# Patient Record
Sex: Male | Born: 1995 | Hispanic: Refuse to answer | Smoking: Current every day smoker
Health system: Southern US, Community
[De-identification: ages and names within clinical notes are randomized; demographics above are authoritative.]

## PROBLEM LIST (undated history)

## (undated) DIAGNOSIS — J45909 Unspecified asthma, uncomplicated: Secondary | ICD-10-CM

## (undated) DIAGNOSIS — L03019 Cellulitis of unspecified finger: Secondary | ICD-10-CM

## (undated) HISTORY — DX: Cellulitis of unspecified finger: L03.019

---
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
## 2014-12-06 ENCOUNTER — Ambulatory Visit (INDEPENDENT_AMBULATORY_CARE_PROVIDER_SITE_OTHER): Payer: 59 | Admitting: Family Medicine

## 2014-12-06 ENCOUNTER — Encounter: Payer: Self-pay | Admitting: Family Medicine

## 2014-12-06 VITALS — BP 102/58 | HR 60 | Temp 97.8°F | Ht 70.0 in | Wt 146.5 lb

## 2014-12-06 DIAGNOSIS — M546 Pain in thoracic spine: Secondary | ICD-10-CM

## 2014-12-06 DIAGNOSIS — Z23 Encounter for immunization: Secondary | ICD-10-CM

## 2014-12-06 DIAGNOSIS — M2170 Unequal limb length (acquired), unspecified site: Secondary | ICD-10-CM

## 2014-12-06 DIAGNOSIS — M419 Scoliosis, unspecified: Secondary | ICD-10-CM

## 2014-12-06 NOTE — Progress Notes (Signed)
Pre visit review using our clinic review tool, if applicable. No additional management support is needed unless otherwise documented below in the visit note.  New patient.  Requesting records.  Flu shot done today.   Back pain.  H/o scoliosis and prev leg length discrepancy, noted in [employer] "when it looked funny when I tried to stand at attention and salute."  Upper mid back pain.  Noted in AM, when getting out of bed.  Better as the day goes on.  No trauma.  No bruising, no rash.  No arm or leg sx.  No weakness.   Is cutting back on smoking, down to a few cigs a day.   PMH and SH reviewed  ROS: See HPI, otherwise noncontributory.  Meds, vitals, and allergies reviewed.   GEN: nad, alert and oriented HEENT: mucous membranes moist NECK: supple w/o LA CV: rrr.  no murmur PULM: ctab, no inc wob ABD: soft, +bs EXT: no edema SKIN: no acute rash Mild scoliotic changes noted, L leg 1/4" longer on exam.

## 2014-12-06 NOTE — Patient Instructions (Signed)
Use a heel lift under your insole and that should help.  Keep stretching your back and take ibuprofen  twice a day if needed.  Take care.  Glad to see you.

## 2014-12-07 ENCOUNTER — Telehealth: Payer: Self-pay | Admitting: Family Medicine

## 2014-12-07 ENCOUNTER — Encounter: Payer: Self-pay | Admitting: Family Medicine

## 2014-12-07 DIAGNOSIS — M419 Scoliosis, unspecified: Secondary | ICD-10-CM | POA: Insufficient documentation

## 2014-12-07 DIAGNOSIS — M2170 Unequal limb length (acquired), unspecified site: Secondary | ICD-10-CM | POA: Insufficient documentation

## 2014-12-07 DIAGNOSIS — M549 Dorsalgia, unspecified: Secondary | ICD-10-CM | POA: Insufficient documentation

## 2014-12-07 NOTE — Assessment & Plan Note (Signed)
Likely from leg length discrepancy, d/w pt.  No specific intervention other then stretching his back and heel lift.

## 2014-12-07 NOTE — Assessment & Plan Note (Signed)
Likely from posture changes and scoliosis.  Would use a heel lift, f/u prn. He agrees.

## 2014-12-07 NOTE — Telephone Encounter (Signed)
emmi mailed  °

## 2014-12-07 NOTE — Assessment & Plan Note (Signed)
D/w pt about getting a heel lift. That should help.

## 2015-03-14 ENCOUNTER — Encounter: Payer: Self-pay | Admitting: Family Medicine

## 2015-03-14 ENCOUNTER — Ambulatory Visit (INDEPENDENT_AMBULATORY_CARE_PROVIDER_SITE_OTHER): Payer: 59 | Admitting: Family Medicine

## 2015-03-14 VITALS — BP 118/80 | HR 95 | Temp 97.5°F | Wt 144.5 lb

## 2015-03-14 DIAGNOSIS — S025XXB Fracture of tooth (traumatic), initial encounter for open fracture: Secondary | ICD-10-CM | POA: Diagnosis not present

## 2015-03-14 DIAGNOSIS — S025XXA Fracture of tooth (traumatic), initial encounter for closed fracture: Secondary | ICD-10-CM | POA: Insufficient documentation

## 2015-03-14 MED ORDER — IBUPROFEN 600 MG PO TABS
600.0000 mg | ORAL_TABLET | Freq: Four times a day (QID) | ORAL | Status: DC | PRN
Start: 2015-03-14 — End: 2016-03-09

## 2015-03-14 MED ORDER — HYDROCODONE-ACETAMINOPHEN 5-325 MG PO TABS: 1.0000 | ORAL_TABLET | Freq: Four times a day (QID) | ORAL | Status: DC | PRN

## 2015-03-14 NOTE — Assessment & Plan Note (Signed)
Wouldn't appear to need abx at this point.  Can use ibuprofen for pain with food.  Take vicodin for refractory pain with routine cautions re: sedation and driving.   Keep dental appointment.  We didn't xray today.  D/w pt.  He agrees.

## 2015-03-14 NOTE — Progress Notes (Signed)
Pre visit review using our clinic review tool, if applicable. No additional management support is needed unless otherwise documented below in the visit note.  Fell and chipped a tooth yesterday.  He slipped and hit a metal bar on the way down.  He felt the tooth pop.  L upper central incisor with lateral chip noted.  He has f/u with dental clinic on 03/17/15.    No jaw pain, just tooth pain.  No FCNAVD.  No LOC.  Tooth is cold sensitive.  No bleeding.    Meds, vitals, and allergies reviewed.   ROS: See HPI.  Otherwise, noncontributory.  nad ncat OP wnl w/o acute tooth change except for L upper central incisor with lateral chip noted.  Looks to be superficial, w/o exposed pulp/nerve.  Not noted to be mobile on exam but I didn't manipulate the tooth to a great extent.  Gum line appears grossly wnl

## 2015-03-14 NOTE — Patient Instructions (Signed)
Ibuprofen with food.  Take vicodon in the pain continues.   Sedation caution.  No chewing on your front teeth.  No cold foods.  If bleeding or fever, then notify us.  Keep the dental appointment.  Take care.

## 2016-03-06 ENCOUNTER — Emergency Department (HOSPITAL_COMMUNITY): Payer: Medicaid Other | Admitting: Certified Registered Nurse Anesthetist

## 2016-03-06 ENCOUNTER — Emergency Department (HOSPITAL_COMMUNITY): Payer: Medicaid Other

## 2016-03-06 ENCOUNTER — Inpatient Hospital Stay (HOSPITAL_COMMUNITY)
Admission: EM | Admit: 2016-03-06 | Discharge: 2016-03-08 | DRG: 956 | Disposition: A | Discharge status: Home Health | Payer: Medicaid Other | Attending: Orthopaedic Surgery | Admitting: Orthopaedic Surgery

## 2016-03-06 ENCOUNTER — Encounter (HOSPITAL_COMMUNITY)
Admission: EM | Disposition: A | Discharge status: Home Health | Payer: Self-pay | Source: Home / Self Care | Attending: Orthopaedic Surgery

## 2016-03-06 ENCOUNTER — Encounter (HOSPITAL_COMMUNITY): Payer: Self-pay | Admitting: *Deleted

## 2016-03-06 DIAGNOSIS — S7292XA Unspecified fracture of left femur, initial encounter for closed fracture: Secondary | ICD-10-CM | POA: Diagnosis present

## 2016-03-06 DIAGNOSIS — Z880 Allergy status to penicillin: Secondary | ICD-10-CM

## 2016-03-06 DIAGNOSIS — F17210 Nicotine dependence, cigarettes, uncomplicated: Secondary | ICD-10-CM | POA: Diagnosis present

## 2016-03-06 DIAGNOSIS — S72302A Unspecified fracture of shaft of left femur, initial encounter for closed fracture: Secondary | ICD-10-CM | POA: Diagnosis present

## 2016-03-06 DIAGNOSIS — S92341B Displaced fracture of fourth metatarsal bone, right foot, initial encounter for open fracture: Secondary | ICD-10-CM | POA: Diagnosis present

## 2016-03-06 DIAGNOSIS — S91321A Laceration with foreign body, right foot, initial encounter: Secondary | ICD-10-CM | POA: Diagnosis present

## 2016-03-06 DIAGNOSIS — S72332A Displaced oblique fracture of shaft of left femur, initial encounter for closed fracture: Principal | ICD-10-CM | POA: Diagnosis present

## 2016-03-06 DIAGNOSIS — S01511A Laceration without foreign body of lip, initial encounter: Secondary | ICD-10-CM | POA: Diagnosis present

## 2016-03-06 DIAGNOSIS — S27322A Contusion of lung, bilateral, initial encounter: Secondary | ICD-10-CM | POA: Diagnosis present

## 2016-03-06 HISTORY — PX: INCISION AND DRAINAGE OF WOUND: SHX1803

## 2016-03-06 HISTORY — PX: FEMUR IM NAIL: SHX1597

## 2016-03-06 LAB — PROTIME-INR
INR: 1.08 (ref 0.00–1.49)
Prothrombin Time: 14.2 seconds (ref 11.6–15.2)

## 2016-03-06 LAB — COMPREHENSIVE METABOLIC PANEL
ALT: 29 U/L (ref 17–63)
AST: 40 U/L (ref 15–41)
Albumin: 3.7 g/dL (ref 3.5–5.0)
Alkaline Phosphatase: 44 U/L (ref 38–126)
Anion gap: 10 (ref 5–15)
BUN: 9 mg/dL (ref 6–20)
CO2: 25 mmol/L (ref 22–32)
Calcium: 8.3 mg/dL — ABNORMAL LOW (ref 8.9–10.3)
Chloride: 106 mmol/L (ref 101–111)
Creatinine, Ser: 1.1 mg/dL (ref 0.61–1.24)
GFR calc Af Amer: >60 mL/min (ref >60)
GLUCOSE: 138 mg/dL — AB (ref 65–99)
POTASSIUM: 3.8 mmol/L (ref 3.5–5.1)
Sodium: 141 mmol/L (ref 135–145)
Total Bilirubin: 0.8 mg/dL (ref 0.3–1.2)
Total Protein: 6.2 g/dL — ABNORMAL LOW (ref 6.5–8.1)

## 2016-03-06 LAB — CBC
HEMATOCRIT: 47.7 % (ref 39.0–52.0)
Hemoglobin: 16.5 g/dL (ref 13.0–17.0)
MCH: 32 pg (ref 26.0–34.0)
MCHC: 34.6 g/dL (ref 30.0–36.0)
MCV: 92.6 fL (ref 78.0–100.0)
PLATELETS: 193 10*3/uL (ref 150–400)
RBC: 5.15 MIL/uL (ref 4.22–5.81)
RDW: 12.7 % (ref 11.5–15.5)
WBC: 28.3 10*3/uL — ABNORMAL HIGH (ref 4.0–10.5)

## 2016-03-06 LAB — SAMPLE TO BLOOD BANK

## 2016-03-06 LAB — CDS SEROLOGY

## 2016-03-06 LAB — ETHANOL: Alcohol, Ethyl (B): <5 mg/dL (ref <5)

## 2016-03-06 SURGERY — INSERTION, INTRAMEDULLARY ROD, FEMUR
Anesthesia: General | Site: Leg Upper | Laterality: Right

## 2016-03-06 MED ORDER — CLINDAMYCIN PHOSPHATE 900 MG/50ML IV SOLN
900.0000 mg | Freq: Once | INTRAVENOUS | Status: AC
  Administered 2016-03-06: 900 mg via INTRAVENOUS
  Filled 2016-03-06 (×2): qty 50

## 2016-03-06 MED ORDER — ROCURONIUM BROMIDE 50 MG/5ML IV SOLN
INTRAVENOUS | Status: AC
  Filled 2016-03-06: qty 1

## 2016-03-06 MED ORDER — SODIUM CHLORIDE 0.9 % IV BOLUS (SEPSIS)
1000.0000 mL | Freq: Once | INTRAVENOUS | Status: AC
  Administered 2016-03-06: 1000 mL via INTRAVENOUS

## 2016-03-06 MED ORDER — CEFAZOLIN SODIUM-DEXTROSE 2-4 GM/100ML-% IV SOLN
2.0000 g | Freq: Three times a day (TID) | INTRAVENOUS | Status: DC
  Filled 2016-03-06 (×3): qty 100

## 2016-03-06 MED ORDER — IOPAMIDOL (ISOVUE-300) INJECTION 61%
100.0000 mL | Freq: Once | INTRAVENOUS | Status: AC | PRN
  Administered 2016-03-06: 100 mL via INTRAVENOUS

## 2016-03-06 MED ORDER — LIDOCAINE HCL (CARDIAC) 20 MG/ML IV SOLN
INTRAVENOUS | Status: AC
  Filled 2016-03-06: qty 5

## 2016-03-06 MED ORDER — HYDROMORPHONE HCL 1 MG/ML IJ SOLN
INTRAMUSCULAR | Status: AC
  Filled 2016-03-06: qty 1

## 2016-03-06 MED ORDER — DEXAMETHASONE SODIUM PHOSPHATE 4 MG/ML IJ SOLN
INTRAMUSCULAR | Status: AC
  Filled 2016-03-06: qty 1

## 2016-03-06 MED ORDER — HYDROMORPHONE HCL 1 MG/ML IJ SOLN
1.0000 mg | Freq: Once | INTRAMUSCULAR | Status: AC
  Administered 2016-03-06: 1 mg via INTRAVENOUS

## 2016-03-06 MED ORDER — HYDROMORPHONE HCL 1 MG/ML IJ SOLN
INTRAMUSCULAR | Status: AC | PRN
  Administered 2016-03-06: 1 mg via INTRAVENOUS

## 2016-03-06 MED ORDER — FENTANYL CITRATE (PF) 250 MCG/5ML IJ SOLN
INTRAMUSCULAR | Status: AC
  Filled 2016-03-06: qty 5

## 2016-03-06 MED ORDER — MIDAZOLAM HCL 2 MG/2ML IJ SOLN
INTRAMUSCULAR | Status: AC
  Filled 2016-03-06: qty 2

## 2016-03-06 MED ORDER — TETANUS-DIPHTH-ACELL PERTUSSIS 5-2.5-18.5 LF-MCG/0.5 IM SUSP
0.5000 mL | Freq: Once | INTRAMUSCULAR | Status: AC
  Administered 2016-03-06: 0.5 mL via INTRAMUSCULAR
  Filled 2016-03-06: qty 0.5

## 2016-03-06 MED ORDER — PROPOFOL 10 MG/ML IV BOLUS
INTRAVENOUS | Status: AC
  Filled 2016-03-06: qty 20

## 2016-03-06 MED ORDER — IOPAMIDOL (ISOVUE-300) INJECTION 61%
INTRAVENOUS | Status: AC
Start: 2016-03-06 — End: 2016-03-07
  Filled 2016-03-06: qty 100

## 2016-03-06 MED ORDER — HYDROMORPHONE HCL 1 MG/ML IJ SOLN: 1.0000 mg | Freq: Once | INTRAMUSCULAR | Status: DC

## 2016-03-06 MED ORDER — ROCURONIUM BROMIDE 50 MG/5ML IV SOLN
INTRAVENOUS | Status: AC
  Filled 2016-03-06: qty 2

## 2016-03-06 SURGICAL SUPPLY — 53 items
BANDAGE ACE 4X5 VEL STRL LF (GAUZE/BANDAGES/DRESSINGS) ×2 IMPLANT
BIT DRILL LONG 4.0 (BIT) IMPLANT
BIT DRILL SHORT 4.0 (BIT) IMPLANT
BNDG COHESIVE 4X5 TAN NS LF (GAUZE/BANDAGES/DRESSINGS) ×4 IMPLANT
BNDG GAUZE ELAST 4 BULKY (GAUZE/BANDAGES/DRESSINGS) ×2 IMPLANT
COVER PERINEAL POST (MISCELLANEOUS) ×4 IMPLANT
COVER SURGICAL LIGHT HANDLE (MISCELLANEOUS) ×4 IMPLANT
DRAPE INCISE IOBAN 66X45 STRL (DRAPES) ×2 IMPLANT
DRAPE STERI IOBAN 125X83 (DRAPES) ×4 IMPLANT
DRILL BIT LONG 4.0 (BIT) ×4
DRILL BIT SHORT 4.0 (BIT) ×8
DRSG MEPILEX BORDER 4X4 (GAUZE/BANDAGES/DRESSINGS) ×2 IMPLANT
DRSG MEPILEX BORDER 4X8 (GAUZE/BANDAGES/DRESSINGS) ×4 IMPLANT
DRSG PAD ABDOMINAL 8X10 ST (GAUZE/BANDAGES/DRESSINGS) ×8 IMPLANT
DURAPREP 26ML APPLICATOR (WOUND CARE) ×4 IMPLANT
ELECT REM PT RETURN 9FT ADLT (ELECTROSURGICAL) ×4
ELECTRODE REM PT RTRN 9FT ADLT (ELECTROSURGICAL) ×2 IMPLANT
FACESHIELD WRAPAROUND (MASK) ×4 IMPLANT
FACESHIELD WRAPAROUND OR TEAM (MASK) ×2 IMPLANT
GAUZE SPONGE 4X4 12PLY STRL (GAUZE/BANDAGES/DRESSINGS) ×2 IMPLANT
GAUZE XEROFORM 1X8 LF (GAUZE/BANDAGES/DRESSINGS) ×2 IMPLANT
GAUZE XEROFORM 5X9 LF (GAUZE/BANDAGES/DRESSINGS) ×4 IMPLANT
GLOVE BIO SURGEON STRL SZ8 (GLOVE) ×4 IMPLANT
GLOVE BIOGEL PI IND STRL 8 (GLOVE) ×2 IMPLANT
GLOVE BIOGEL PI INDICATOR 8 (GLOVE) ×2
GLOVE ORTHO TXT STRL SZ7.5 (GLOVE) ×4 IMPLANT
GOWN STRL REUS W/ TWL LRG LVL3 (GOWN DISPOSABLE) ×2 IMPLANT
GOWN STRL REUS W/ TWL XL LVL3 (GOWN DISPOSABLE) ×4 IMPLANT
GOWN STRL REUS W/TWL LRG LVL3 (GOWN DISPOSABLE) ×4
GOWN STRL REUS W/TWL XL LVL3 (GOWN DISPOSABLE) ×8
GUIDE PIN 3.2X343 (PIN) ×2
GUIDE PIN 3.2X343MM (PIN) ×4
KIT BASIN OR (CUSTOM PROCEDURE TRAY) ×4 IMPLANT
KIT ROOM TURNOVER OR (KITS) ×4 IMPLANT
LINER BOOT UNIVERSAL DISP (MISCELLANEOUS) ×4 IMPLANT
MANIFOLD NEPTUNE II (INSTRUMENTS) ×4 IMPLANT
NAIL FEMORAL 10X40 L (Nail) ×2 IMPLANT
NS IRRIG 1000ML POUR BTL (IV SOLUTION) ×4 IMPLANT
PACK GENERAL/GYN (CUSTOM PROCEDURE TRAY) ×4 IMPLANT
PAD ARMBOARD 7.5X6 YLW CONV (MISCELLANEOUS) ×8 IMPLANT
PAD CAST 4YDX4 CTTN HI CHSV (CAST SUPPLIES) ×4 IMPLANT
PADDING CAST COTTON 4X4 STRL (CAST SUPPLIES) ×8
PIN GUIDE 3.2X343MM (PIN) IMPLANT
SCREW TRIGEN LOW PROF 5.0X35 (Screw) ×2 IMPLANT
SCREW TRIGEN LOW PROF 5.0X60 (Screw) ×2 IMPLANT
STAPLER VISISTAT 35W (STAPLE) ×4 IMPLANT
SUT VIC AB 0 CT1 27 (SUTURE) ×8
SUT VIC AB 0 CT1 27XBRD ANBCTR (SUTURE) ×4 IMPLANT
SUT VIC AB 2-0 CT1 27 (SUTURE) ×8
SUT VIC AB 2-0 CT1 TAPERPNT 27 (SUTURE) ×4 IMPLANT
TOWEL OR 17X24 6PK STRL BLUE (TOWEL DISPOSABLE) ×4 IMPLANT
TOWEL OR 17X26 10 PK STRL BLUE (TOWEL DISPOSABLE) ×4 IMPLANT
WATER STERILE IRR 1000ML POUR (IV SOLUTION) ×4 IMPLANT

## 2016-03-06 NOTE — ED Notes (Signed)
Consent for surgery signed by Dr.Blackman and father; pt remains in Ct at this time

## 2016-03-06 NOTE — H&P (Signed)
  See full consult note in EPIC

## 2016-03-06 NOTE — ED Notes (Signed)
Pt taken to Ct. 

## 2016-03-06 NOTE — ED Notes (Signed)
Sutures placed to L bottom lip

## 2016-03-06 NOTE — Progress Notes (Signed)
Orthopedic Tech Progress Note Patient Details:  Clayton Walker [birth year] [id number]  Musculoskeletal Traction Type of Traction: Gilberto BetterHare Traction Traction Location: LLE    [name], [name] 03/06/2016, 9:40 PM
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
## 2016-03-06 NOTE — Anesthesia Preprocedure Evaluation (Signed)
Anesthesia Evaluation  Patient identified by MRN, date of birth, ID band Patient awake    Reviewed: Allergy & Precautions, NPO status , Patient's Chart, lab work & pertinent test results  Airway Mallampati: I  TM Distance: >3 FB Neck ROM: Full    Dental   Pulmonary Current Smoker,    Pulmonary exam normal        Cardiovascular Normal cardiovascular exam     Neuro/Psych    GI/Hepatic   Endo/Other    Renal/GU      Musculoskeletal   Abdominal   Peds  Hematology   Anesthesia Other Findings   Reproductive/Obstetrics                             Anesthesia Physical Anesthesia Plan  ASA: II and emergent  Anesthesia Plan: General   Post-op Pain Management:    Induction: Intravenous, Rapid sequence and Cricoid pressure planned  Airway Management Planned: Oral ETT  Additional Equipment:   Intra-op Plan:   Post-operative Plan: Extubation in OR  Informed Consent: I have reviewed the patients History and Physical, chart, labs and discussed the procedure including the risks, benefits and alternatives for the proposed anesthesia with the patient or authorized representative who has indicated his/her understanding and acceptance.     Plan Discussed with: CRNA and Surgeon  Anesthesia Plan Comments:         Anesthesia Quick Evaluation  
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
## 2016-03-07 ENCOUNTER — Encounter (HOSPITAL_COMMUNITY): Payer: Self-pay | Admitting: Orthopaedic Surgery

## 2016-03-07 ENCOUNTER — Inpatient Hospital Stay (HOSPITAL_COMMUNITY): Payer: Medicaid Other

## 2016-03-07 DIAGNOSIS — Y999 Unspecified external cause status: Secondary | ICD-10-CM | POA: Diagnosis not present

## 2016-03-07 DIAGNOSIS — R509 Fever, unspecified: Secondary | ICD-10-CM | POA: Diagnosis not present

## 2016-03-07 DIAGNOSIS — S72302A Unspecified fracture of shaft of left femur, initial encounter for closed fracture: Secondary | ICD-10-CM | POA: Diagnosis present

## 2016-03-07 DIAGNOSIS — S7292XA Unspecified fracture of left femur, initial encounter for closed fracture: Secondary | ICD-10-CM | POA: Diagnosis present

## 2016-03-07 DIAGNOSIS — S0081XA Abrasion of other part of head, initial encounter: Secondary | ICD-10-CM | POA: Diagnosis not present

## 2016-03-07 DIAGNOSIS — S0993XA Unspecified injury of face, initial encounter: Secondary | ICD-10-CM | POA: Diagnosis not present

## 2016-03-07 DIAGNOSIS — R079 Chest pain, unspecified: Secondary | ICD-10-CM | POA: Diagnosis not present

## 2016-03-07 DIAGNOSIS — S01511A Laceration without foreign body of lip, initial encounter: Secondary | ICD-10-CM | POA: Diagnosis present

## 2016-03-07 DIAGNOSIS — S91321A Laceration with foreign body, right foot, initial encounter: Secondary | ICD-10-CM | POA: Diagnosis present

## 2016-03-07 DIAGNOSIS — S50811A Abrasion of right forearm, initial encounter: Secondary | ICD-10-CM | POA: Diagnosis not present

## 2016-03-07 DIAGNOSIS — Z4801 Encounter for change or removal of surgical wound dressing: Secondary | ICD-10-CM | POA: Diagnosis present

## 2016-03-07 DIAGNOSIS — F17210 Nicotine dependence, cigarettes, uncomplicated: Secondary | ICD-10-CM | POA: Diagnosis present

## 2016-03-07 DIAGNOSIS — S27322A Contusion of lung, bilateral, initial encounter: Secondary | ICD-10-CM | POA: Diagnosis present

## 2016-03-07 DIAGNOSIS — Y9389 Activity, other specified: Secondary | ICD-10-CM | POA: Diagnosis not present

## 2016-03-07 DIAGNOSIS — S72332A Displaced oblique fracture of shaft of left femur, initial encounter for closed fracture: Secondary | ICD-10-CM | POA: Diagnosis present

## 2016-03-07 DIAGNOSIS — W1841XA Slipping, tripping and stumbling without falling due to stepping on object, initial encounter: Secondary | ICD-10-CM | POA: Diagnosis not present

## 2016-03-07 DIAGNOSIS — S92341B Displaced fracture of fourth metatarsal bone, right foot, initial encounter for open fracture: Secondary | ICD-10-CM | POA: Diagnosis present

## 2016-03-07 DIAGNOSIS — Y9289 Other specified places as the place of occurrence of the external cause: Secondary | ICD-10-CM | POA: Diagnosis not present

## 2016-03-07 DIAGNOSIS — Z8781 Personal history of (healed) traumatic fracture: Secondary | ICD-10-CM | POA: Diagnosis not present

## 2016-03-07 DIAGNOSIS — Z880 Allergy status to penicillin: Secondary | ICD-10-CM | POA: Diagnosis not present

## 2016-03-07 DIAGNOSIS — R000 Tachycardia, unspecified: Secondary | ICD-10-CM | POA: Diagnosis not present

## 2016-03-07 DIAGNOSIS — S9031XA Contusion of right foot, initial encounter: Secondary | ICD-10-CM | POA: Diagnosis not present

## 2016-03-07 DIAGNOSIS — G8918 Other acute postprocedural pain: Secondary | ICD-10-CM | POA: Diagnosis not present

## 2016-03-07 DIAGNOSIS — J45901 Unspecified asthma with (acute) exacerbation: Secondary | ICD-10-CM | POA: Diagnosis not present

## 2016-03-07 MED ORDER — FENTANYL CITRATE (PF) 100 MCG/2ML IJ SOLN
INTRAMUSCULAR | Status: DC | PRN
  Administered 2016-03-06 – 2016-03-07 (×10): 50 ug via INTRAVENOUS

## 2016-03-07 MED ORDER — SUCCINYLCHOLINE CHLORIDE 20 MG/ML IJ SOLN
INTRAMUSCULAR | Status: DC | PRN
  Administered 2016-03-06: 120 mg via INTRAVENOUS

## 2016-03-07 MED ORDER — METOCLOPRAMIDE HCL 5 MG PO TABS: 5.0000 mg | ORAL_TABLET | Freq: Three times a day (TID) | ORAL | Status: DC | PRN

## 2016-03-07 MED ORDER — NEOSTIGMINE METHYLSULFATE 10 MG/10ML IV SOLN
INTRAVENOUS | Status: DC | PRN
  Administered 2016-03-07: 2.5 mg via INTRAVENOUS

## 2016-03-07 MED ORDER — GLYCOPYRROLATE 0.2 MG/ML IJ SOLN
INTRAMUSCULAR | Status: DC | PRN
  Administered 2016-03-07: .5 mg via INTRAVENOUS

## 2016-03-07 MED ORDER — MIDAZOLAM HCL 5 MG/5ML IJ SOLN
INTRAMUSCULAR | Status: DC | PRN
  Administered 2016-03-06: 2 mg via INTRAVENOUS

## 2016-03-07 MED ORDER — ROCURONIUM BROMIDE 100 MG/10ML IV SOLN
INTRAVENOUS | Status: DC | PRN
  Administered 2016-03-06: 30 mg via INTRAVENOUS
  Administered 2016-03-07 (×2): 10 mg via INTRAVENOUS

## 2016-03-07 MED ORDER — ONDANSETRON HCL 4 MG/2ML IJ SOLN: 4.0000 mg | Freq: Once | INTRAMUSCULAR | Status: DC | PRN

## 2016-03-07 MED ORDER — HYDROMORPHONE HCL 1 MG/ML IJ SOLN: 0.2500 mg | INTRAMUSCULAR | Status: DC | PRN

## 2016-03-07 MED ORDER — METHOCARBAMOL 1000 MG/10ML IJ SOLN: 500.0000 mg | Freq: Four times a day (QID) | INTRAVENOUS | Status: DC | PRN

## 2016-03-07 MED ORDER — SODIUM CHLORIDE 0.9 % IV SOLN
INTRAVENOUS | Status: DC
  Administered 2016-03-07: 03:00:00 via INTRAVENOUS

## 2016-03-07 MED ORDER — DEXAMETHASONE SODIUM PHOSPHATE 4 MG/ML IJ SOLN
INTRAMUSCULAR | Status: DC | PRN
  Administered 2016-03-06: 4 mg via INTRAVENOUS

## 2016-03-07 MED ORDER — ONDANSETRON HCL 4 MG/2ML IJ SOLN: 4.0000 mg | Freq: Four times a day (QID) | INTRAMUSCULAR | Status: DC | PRN

## 2016-03-07 MED ORDER — 0.9 % SODIUM CHLORIDE (POUR BTL) OPTIME
TOPICAL | Status: DC | PRN
  Administered 2016-03-07: 1000 mL

## 2016-03-07 MED ORDER — HYDROMORPHONE HCL 1 MG/ML IJ SOLN
1.0000 mg | INTRAMUSCULAR | Status: DC | PRN
  Administered 2016-03-07 – 2016-03-08 (×13): 1 mg via INTRAVENOUS
  Filled 2016-03-07 (×12): qty 1

## 2016-03-07 MED ORDER — OXYCODONE HCL 5 MG PO TABS
5.0000 mg | ORAL_TABLET | ORAL | Status: DC | PRN
  Administered 2016-03-07 – 2016-03-08 (×7): 10 mg via ORAL
  Filled 2016-03-07 (×7): qty 2

## 2016-03-07 MED ORDER — METOCLOPRAMIDE HCL 5 MG/ML IJ SOLN: 5.0000 mg | Freq: Three times a day (TID) | INTRAMUSCULAR | Status: DC | PRN

## 2016-03-07 MED ORDER — ONDANSETRON HCL 4 MG/2ML IJ SOLN
INTRAMUSCULAR | Status: DC | PRN
  Administered 2016-03-07: 4 mg via INTRAVENOUS

## 2016-03-07 MED ORDER — MEPERIDINE HCL 25 MG/ML IJ SOLN: 6.2500 mg | INTRAMUSCULAR | Status: DC | PRN

## 2016-03-07 MED ORDER — PROPOFOL 10 MG/ML IV BOLUS
INTRAVENOUS | Status: DC | PRN
  Administered 2016-03-06: 120 mg via INTRAVENOUS

## 2016-03-07 MED ORDER — ONDANSETRON HCL 4 MG PO TABS: 4.0000 mg | ORAL_TABLET | Freq: Four times a day (QID) | ORAL | Status: DC | PRN

## 2016-03-07 MED ORDER — LIDOCAINE HCL (CARDIAC) 20 MG/ML IV SOLN
INTRAVENOUS | Status: DC | PRN
  Administered 2016-03-06: 100 mg via INTRAVENOUS

## 2016-03-07 MED ORDER — ROCURONIUM BROMIDE 50 MG/5ML IV SOLN
INTRAVENOUS | Status: AC
  Filled 2016-03-07: qty 1

## 2016-03-07 MED ORDER — DIPHENHYDRAMINE HCL 12.5 MG/5ML PO ELIX: 12.5000 mg | ORAL_SOLUTION | ORAL | Status: DC | PRN

## 2016-03-07 MED ORDER — LACTATED RINGERS IV SOLN
INTRAVENOUS | Status: DC | PRN
  Administered 2016-03-06 – 2016-03-07 (×2): via INTRAVENOUS

## 2016-03-07 MED ORDER — ACETAMINOPHEN 650 MG RE SUPP: 650.0000 mg | Freq: Four times a day (QID) | RECTAL | Status: DC | PRN

## 2016-03-07 MED ORDER — ONDANSETRON HCL 4 MG/2ML IJ SOLN
INTRAMUSCULAR | Status: AC
  Filled 2016-03-07: qty 8

## 2016-03-07 MED ORDER — ACETAMINOPHEN 325 MG PO TABS: 650.0000 mg | ORAL_TABLET | Freq: Four times a day (QID) | ORAL | Status: DC | PRN

## 2016-03-07 MED ORDER — CLINDAMYCIN PHOSPHATE 600 MG/50ML IV SOLN
600.0000 mg | Freq: Four times a day (QID) | INTRAVENOUS | Status: AC
  Administered 2016-03-07 (×3): 600 mg via INTRAVENOUS
  Filled 2016-03-07 (×4): qty 50

## 2016-03-07 MED ORDER — METHOCARBAMOL 500 MG PO TABS
500.0000 mg | ORAL_TABLET | Freq: Four times a day (QID) | ORAL | Status: DC | PRN
  Administered 2016-03-07 – 2016-03-08 (×4): 500 mg via ORAL
  Filled 2016-03-07 (×4): qty 1

## 2016-03-07 NOTE — Progress Notes (Signed)
Orthopedic Tech Progress Note Patient Details:  Clayton Walker [birth year] [id number]  Ortho Devices Type of Ortho Device: Postop shoe/boot Ortho Device/Splint Location: rle Ortho Device/Splint Interventions: Application   Shareta Fishbaugh 03/07/2016, 8:29 AM
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
## 2016-03-07 NOTE — Anesthesia Postprocedure Evaluation (Signed)
Anesthesia Post Note  Patient: Shady L Gatt  Procedure(s) Performed: Procedure(s) (LRB): INTRAMEDULLARY (IM) NAIL FEMORAL LEFT (Left) IRRIGATION AND DEBRIDEMENT RIGHT FOOT WOUND AND LACERATION CLOSURE (Right)  Patient location during evaluation: PACU Anesthesia Type: General Level of consciousness: awake and alert Pain management: pain level controlled Vital Signs Assessment: post-procedure vital signs reviewed and stable Respiratory status: spontaneous breathing, nonlabored ventilation, respiratory function stable and patient connected to nasal cannula oxygen Cardiovascular status: blood pressure returned to baseline and stable Postop Assessment: no signs of nausea or vomiting Anesthetic complications: no    Last Vitals:  Filed Vitals:   03/07/16 0200 03/07/16 0250  BP: 150/83 140/94  Pulse: 126 110  Temp: 36.7 C 36.5 C  Resp: 14 18    Last Pain:  Filed Vitals:   03/07/16 0251  PainSc: Asleep                 Ceola Para DAVID

## 2016-03-07 NOTE — Brief Op Note (Signed)
03/06/2016 - 03/07/2016  1:16 AM  PATIENT:  Clayton Walker  20 y.o. male  PRE-OPERATIVE DIAGNOSIS:  Left femur fracture  POST-OPERATIVE DIAGNOSIS:  Left femur fracture  PROCEDURE:  Procedure(s): INTRAMEDULLARY (IM) NAIL FEMORAL LEFT (Left) IRRIGATION AND DEBRIDEMENT RIGHT FOOT WOUND AND LACERATION CLOSURE (Right)  SURGEON:  Surgeon(s) and Role:    * Kathryne Hitchhristopher Y Kristine Tiley, MD - Primary  PHYSICIAN ASSISTANT: Rexene EdisonGil Clark, PA-C  ANESTHESIA:   general  EBL:  Total I/O In: 2000 [I.V.:2000] Out: -   COUNTS:  YES  DICTATION: .Other Dictation: Dictation Number [id number]  PLAN OF CARE: Admit to inpatient   PATIENT DISPOSITION:  PACU - hemodynamically stable.   Delay start of Pharmacological VTE agent (>24hrs) due to surgical blood loss or risk of bleeding: no
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
## 2016-03-07 NOTE — Care Management Note (Signed)
Case Management Note  Patient Details  Name: Elwyn LadeDominick L Verastegui MRN: [id number] Date of Birth: Jun 13, 1996  Subjective/Objective:                    Action/Plan:  Confirmed face sheet information with patient . Address is correct , phone number is his father's . Patient states he has no phone .    Patient listed as uninsured . Patient states he is not sure if he has insurance or not " my Dad might have insurance for me" . Patient consented for NCM to call patient's father Janetta HoraBunly Grantham  At [phone] . Called patient's father left voice mail , awaiting call back.  Expected Discharge Date:                  Expected Discharge Plan:  Home w Home Health Services  In-House Referral:     Discharge planning Services  CM Consult  Post Acute Care Choice:  Home Health, Durable Medical Equipment Choice offered to:  Patient  DME Arranged:  Walker rolling DME Agency:  [facility]  HH [facility]  [facility]:     Status of Service:  In process, will continue to follow  Medicare Important Message Given:    Date Medicare IM Given:    Medicare IM give by:    Date Additional Medicare IM Given:    Additional Medicare Important Message give by:     If discussed at Long Length of Stay Meetings, dates discussed:    Additional Comments:  Kingsley PlanWile, Tomara Youngberg Marie, RN 03/07/2016, 3:31 PM
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
## 2016-03-07 NOTE — Anesthesia Procedure Notes (Signed)
Procedure Name: Intubation Date/Time: 03/06/2016 11:46 PM Performed by: Julianne RiceBILOTTA, Clayton Valbuena Z Pre-anesthesia Checklist: Patient identified, Timeout performed, Emergency Drugs available, Suction available and Patient being monitored Patient Re-evaluated:Patient Re-evaluated prior to inductionOxygen Delivery Method: Circle system utilized Preoxygenation: Pre-oxygenation with 100% oxygen Intubation Type: IV induction, Rapid sequence and Cricoid Pressure applied Laryngoscope Size: Mac and 3 Grade View: Grade I Tube type: Oral Tube size: 7.5 mm Number of attempts: 1 Airway Equipment and Method: Stylet Placement Confirmation: ETT inserted through vocal cords under direct vision,  breath sounds checked- equal and bilateral and positive ETCO2 Secured at: 22 cm Tube secured with: Tape Dental Injury: Teeth and Oropharynx as per pre-operative assessment

## 2016-03-07 NOTE — Care Management Note (Signed)
Case Management Note  Patient Details  Name: Clayton Walker MRN: [id number] Date of Birth: [birth year]  Subjective/Objective:                    Action/Plan:  Initial UR completed. Await PT/OT evals  Expected Discharge Date:                  Expected Discharge Plan:     In-House Referral:     Discharge planning Services     Post Acute Care Choice:    Choice offered to:     DME Arranged:    DME Agency:     HH Arranged:    HH Agency:     Status of Service:  In process, will continue to follow  Medicare Important Message Given:    Date Medicare IM Given:    Medicare IM give by:    Date Additional Medicare IM Given:    Additional Medicare Important Message give by:     If discussed at Long Length of Stay Meetings, dates discussed:    Additional Comments:  Kingsley PlanWile, Ndidi Nesby Marie, RN 03/07/2016, 10:57 AM

## 2016-03-07 NOTE — Transfer of Care (Signed)
Immediate Anesthesia Transfer of Care Note  Patient: Clayton Walker  Procedure(s) Performed: Procedure(s): INTRAMEDULLARY (IM) NAIL FEMORAL LEFT (Left) IRRIGATION AND DEBRIDEMENT RIGHT FOOT WOUND AND LACERATION CLOSURE (Right)  Patient Location: PACU  Anesthesia Type:General  Level of Consciousness: awake, alert , oriented and patient cooperative  Airway & Oxygen Therapy: Patient Spontanous Breathing and Patient connected to nasal cannula oxygen  Post-op Assessment: Report given to RN and Post -op Vital signs reviewed and stable  Post vital signs: Reviewed and stable  Last Vitals:  Filed Vitals:   03/06/16 2315 03/07/16 0135  BP: 144/90   Pulse: 100 118  Temp:  36.4 C  Resp: 13     Complications: No apparent anesthesia complications

## 2016-03-08 DIAGNOSIS — S0993XA Unspecified injury of face, initial encounter: Secondary | ICD-10-CM | POA: Insufficient documentation

## 2016-03-08 DIAGNOSIS — R509 Fever, unspecified: Secondary | ICD-10-CM | POA: Insufficient documentation

## 2016-03-08 DIAGNOSIS — Z8781 Personal history of (healed) traumatic fracture: Secondary | ICD-10-CM | POA: Insufficient documentation

## 2016-03-08 DIAGNOSIS — W1841XA Slipping, tripping and stumbling without falling due to stepping on object, initial encounter: Secondary | ICD-10-CM | POA: Insufficient documentation

## 2016-03-08 DIAGNOSIS — S50811A Abrasion of right forearm, initial encounter: Secondary | ICD-10-CM | POA: Insufficient documentation

## 2016-03-08 DIAGNOSIS — G8918 Other acute postprocedural pain: Secondary | ICD-10-CM | POA: Diagnosis not present

## 2016-03-08 DIAGNOSIS — S9031XA Contusion of right foot, initial encounter: Secondary | ICD-10-CM | POA: Insufficient documentation

## 2016-03-08 DIAGNOSIS — J45901 Unspecified asthma with (acute) exacerbation: Secondary | ICD-10-CM | POA: Insufficient documentation

## 2016-03-08 DIAGNOSIS — Z880 Allergy status to penicillin: Secondary | ICD-10-CM | POA: Insufficient documentation

## 2016-03-08 DIAGNOSIS — Y9289 Other specified places as the place of occurrence of the external cause: Secondary | ICD-10-CM | POA: Insufficient documentation

## 2016-03-08 DIAGNOSIS — Y999 Unspecified external cause status: Secondary | ICD-10-CM | POA: Insufficient documentation

## 2016-03-08 DIAGNOSIS — R000 Tachycardia, unspecified: Secondary | ICD-10-CM | POA: Insufficient documentation

## 2016-03-08 DIAGNOSIS — Y9389 Activity, other specified: Secondary | ICD-10-CM | POA: Insufficient documentation

## 2016-03-08 DIAGNOSIS — F17210 Nicotine dependence, cigarettes, uncomplicated: Secondary | ICD-10-CM | POA: Insufficient documentation

## 2016-03-08 DIAGNOSIS — R079 Chest pain, unspecified: Secondary | ICD-10-CM | POA: Insufficient documentation

## 2016-03-08 DIAGNOSIS — S0081XA Abrasion of other part of head, initial encounter: Secondary | ICD-10-CM | POA: Insufficient documentation

## 2016-03-08 MED ORDER — OXYCODONE HCL 5 MG PO TABS: 5.0000 mg | ORAL_TABLET | ORAL | Status: DC | PRN

## 2016-03-08 MED ORDER — METHOCARBAMOL 500 MG PO TABS: 500.0000 mg | ORAL_TABLET | Freq: Four times a day (QID) | ORAL | Status: DC | PRN

## 2016-03-08 NOTE — Care Management Note (Signed)
Case Management Note  Patient Details  Name: Elwyn LadeDominick L Mander MRN: [id number] Date of Birth: Nov 04, 1996  Subjective/Objective:                    Action/Plan:   Expected Discharge Date:                  Expected Discharge Plan:  Home w Home Health Services  In-House Referral:     Discharge planning Services  CM Consult  Post Acute Care Choice:  Home Health, Durable Medical Equipment Choice offered to:  Patient  DME Arranged:  Walker rolling DME Agency:  [facility]  [facility]  [facility]  Status of Service:  Completed, signed off  Medicare Important Message Given:    Date Medicare IM Given:    Medicare IM give by:    Date Additional Medicare IM Given:    Additional Medicare Important Message give by:     If discussed at Long Length of Stay Meetings, dates discussed:    Additional Comments:  Kingsley PlanWile, Vaniya Augspurger Marie, RN 03/08/2016, 10:44 AM

## 2016-03-08 NOTE — Progress Notes (Signed)
Pt for dc home today with father, girlfriend to assist.  Pt has no insurance; he is eligible [facility].  [facility] with explanation of program benefits.  Pt appreciative of help.    Quintella BatonJulie W. Charmeka Freeburg, RN, BSN  Trauma/Neuro ICU Case Manager [phone]

## 2016-03-08 NOTE — Progress Notes (Signed)
Subjective: 2 Days Post-Op Procedure(s) (LRB): INTRAMEDULLARY (IM) NAIL FEMORAL LEFT (Left) IRRIGATION AND DEBRIDEMENT RIGHT FOOT WOUND AND LACERATION CLOSURE (Right) Patient reports pain as moderate.  Appears comfortable.  Objective: Vital signs in last 24 hours: Temp:  [98.1 F (36.7 C)-99.7 F (37.6 C)] 99.7 F (37.6 C) (04/06 0527) Pulse Rate:  [111-133] 111 (04/06 0527) Resp:  [16-18] 16 (04/06 0527) BP: (121-134)/(58-89) 134/89 mmHg (04/06 0527) SpO2:  [99 %-100 %] 99 % (04/06 0527)  Intake/Output from previous day:   Intake/Output this shift:     Recent Labs  03/06/16 2134  HGB 16.5    Recent Labs  03/06/16 2134  WBC 28.3*  RBC 5.15  HCT 47.7  PLT 193    Recent Labs  03/06/16 2134  NA 141  K 3.8  CL 106  CO2 25  BUN 9  CREATININE 1.10  GLUCOSE 138*  CALCIUM 8.3*    Recent Labs  03/06/16 2134  INR 1.08   Left lower extremity: Neurovascular intact Sensation intact distally Intact pulses distally Dorsiflexion/Plantar flexion intact Incision: scant drainage Compartment soft   Right lower extremity: Splint clean and dry  Assessment/Plan: 2 Days Post-Op Procedure(s) (LRB): INTRAMEDULLARY (IM) NAIL FEMORAL LEFT (Left) IRRIGATION AND DEBRIDEMENT RIGHT FOOT WOUND AND LACERATION CLOSURE (Right) Up with therapy  Plan discharge to home   Kinsman CenterLARK, Sullivan LoneGILBERT 03/08/2016, 9:45 AM
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
## 2016-03-08 NOTE — Care Management (Signed)
Still awaiting call back from patient's father , regarding if patient has insurance or not . Patient aware his father has not returned phone call. Patient will call his father . Patient expects his father to visit him in "the next few hours" . Patient aware insurance information is needed ASAP.   Ronny FlurryHeather Gema Ringold RN BSN [phone]

## 2016-03-08 NOTE — Discharge Instructions (Signed)
Weight bearing as tolerated bilateral lower extremities

## 2016-03-09 ENCOUNTER — Encounter (HOSPITAL_COMMUNITY): Payer: Self-pay | Admitting: *Deleted

## 2016-03-09 ENCOUNTER — Emergency Department (HOSPITAL_COMMUNITY)
Admission: EM | Admit: 2016-03-09 | Discharge: 2016-03-09 | Disposition: A | Discharge status: Home / Self Care | Payer: Medicaid Other | Attending: Emergency Medicine | Admitting: Emergency Medicine

## 2016-03-09 ENCOUNTER — Emergency Department (HOSPITAL_COMMUNITY): Payer: Medicaid Other

## 2016-03-09 DIAGNOSIS — R509 Fever, unspecified: Secondary | ICD-10-CM

## 2016-03-09 DIAGNOSIS — R079 Chest pain, unspecified: Secondary | ICD-10-CM

## 2016-03-09 DIAGNOSIS — G8918 Other acute postprocedural pain: Secondary | ICD-10-CM

## 2016-03-09 LAB — URINALYSIS, ROUTINE W REFLEX MICROSCOPIC
Bilirubin Urine: NEGATIVE
Glucose, UA: NEGATIVE mg/dL
Hgb urine dipstick: NEGATIVE
KETONES UR: NEGATIVE mg/dL
LEUKOCYTES UA: NEGATIVE
NITRITE: NEGATIVE
PH: 6.5 (ref 5.0–8.0)
Protein, ur: NEGATIVE mg/dL
SPECIFIC GRAVITY, URINE: 1.031 — AB (ref 1.005–1.030)

## 2016-03-09 LAB — CBC WITH DIFFERENTIAL/PLATELET
BASOS PCT: 1 %
Basophils Absolute: 0.1 10*3/uL (ref 0.0–0.1)
EOS ABS: 0.3 10*3/uL (ref 0.0–0.7)
Eosinophils Relative: 2 %
HEMATOCRIT: 34.4 % — AB (ref 39.0–52.0)
HEMOGLOBIN: 12.3 g/dL — AB (ref 13.0–17.0)
LYMPHS ABS: 3 10*3/uL (ref 0.7–4.0)
Lymphocytes Relative: 21 %
MCH: 33.2 pg (ref 26.0–34.0)
MCHC: 35.8 g/dL (ref 30.0–36.0)
MCV: 93 fL (ref 78.0–100.0)
MONOS PCT: 14 %
Monocytes Absolute: 2.1 10*3/uL — ABNORMAL HIGH (ref 0.1–1.0)
NEUTROS ABS: 9.2 10*3/uL — AB (ref 1.7–7.7)
NEUTROS PCT: 62 %
Platelets: 152 10*3/uL (ref 150–400)
RBC: 3.7 MIL/uL — AB (ref 4.22–5.81)
RDW: 12.6 % (ref 11.5–15.5)
WBC: 14.8 10*3/uL — AB (ref 4.0–10.5)

## 2016-03-09 LAB — BASIC METABOLIC PANEL
Anion gap: 10 (ref 5–15)
BUN: <5 mg/dL — ABNORMAL LOW (ref 6–20)
CHLORIDE: 99 mmol/L — AB (ref 101–111)
CO2: 28 mmol/L (ref 22–32)
Calcium: 8.4 mg/dL — ABNORMAL LOW (ref 8.9–10.3)
Creatinine, Ser: 0.86 mg/dL (ref 0.61–1.24)
GFR calc non Af Amer: >60 mL/min (ref >60)
Glucose, Bld: 101 mg/dL — ABNORMAL HIGH (ref 65–99)
POTASSIUM: 3.7 mmol/L (ref 3.5–5.1)
SODIUM: 137 mmol/L (ref 135–145)

## 2016-03-09 LAB — I-STAT CG4 LACTIC ACID, ED: Lactic Acid, Venous: 0.99 mmol/L (ref 0.5–2.0)

## 2016-03-09 MED ORDER — ASPIRIN EC 325 MG PO TBEC: 325.0000 mg | DELAYED_RELEASE_TABLET | Freq: Two times a day (BID) | ORAL | Status: DC

## 2016-03-09 MED ORDER — MORPHINE SULFATE (PF) 4 MG/ML IV SOLN
4.0000 mg | Freq: Once | INTRAVENOUS | Status: AC
  Administered 2016-03-09: 4 mg via INTRAVENOUS
  Filled 2016-03-09: qty 1

## 2016-03-09 MED ORDER — SODIUM CHLORIDE 0.9 % IV SOLN: INTRAVENOUS | Status: DC

## 2016-03-09 MED ORDER — SODIUM CHLORIDE 0.9 % IV BOLUS (SEPSIS)
1000.0000 mL | Freq: Once | INTRAVENOUS | Status: AC
  Administered 2016-03-09: 1000 mL via INTRAVENOUS

## 2016-03-09 MED ORDER — ONDANSETRON HCL 4 MG/2ML IJ SOLN
4.0000 mg | Freq: Once | INTRAMUSCULAR | Status: AC
  Administered 2016-03-09: 4 mg via INTRAVENOUS
  Filled 2016-03-09: qty 2

## 2016-03-09 MED ORDER — MORPHINE SULFATE (PF) 2 MG/ML IV SOLN
1.0000 mg | Freq: Once | INTRAVENOUS | Status: AC
  Administered 2016-03-09: 1 mg via INTRAVENOUS
  Filled 2016-03-09: qty 1

## 2016-03-09 MED ORDER — IBUPROFEN 800 MG PO TABS
800.0000 mg | ORAL_TABLET | Freq: Once | ORAL | Status: AC
  Administered 2016-03-09: 800 mg via ORAL
  Filled 2016-03-09: qty 1

## 2016-03-09 MED ORDER — IOPAMIDOL (ISOVUE-370) INJECTION 76%
INTRAVENOUS | Status: AC
  Administered 2016-03-09: 100 mL
  Filled 2016-03-09: qty 100
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
## 2016-03-09 NOTE — ED Notes (Signed)
Ortho MD at bedside.
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
## 2016-03-09 NOTE — ED Notes (Signed)
MD at bedside. 

## 2016-03-09 NOTE — ED Notes (Signed)
Per pt, he took 2 oxycodone 5mg  at 2100\, 03/08/16.
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
## 2016-03-09 NOTE — Discharge Instructions (Signed)
Fever, Adult A fever is an increase in the body's temperature. It is usually defined as a temperature of 100F (38C) or higher. Brief mild or moderate fevers generally have no long-term effects, and they often do not require treatment. Moderate or high fevers may make you feel uncomfortable and can sometimes be a sign of a serious illness or disease. The sweating that may occur with repeated or prolonged fever may also cause dehydration. Fever is confirmed by taking a temperature with a thermometer. A measured temperature can vary with:  Age.  Time of day.  Location of the thermometer:  Mouth (oral).  Rectum (rectal).  Ear (tympanic).  Underarm (axillary).  Forehead (temporal). HOME CARE INSTRUCTIONS Pay attention to any changes in your symptoms. Take these actions to help with your condition:  Take over-the counter and prescription medicines only as told by your health care provider. Follow the dosing instructions carefully.  If you were prescribed an antibiotic medicine, take it as told by your health care provider. Do not stop taking the antibiotic even if you start to feel better.  Rest as needed.  Drink enough fluid to keep your urine clear or pale yellow. This helps to prevent dehydration.  Sponge yourself or bathe with room-temperature water to help reduce your body temperature as needed. Do not use ice water.  Do not overbundle yourself in blankets or heavy clothes. SEEK MEDICAL CARE IF:  You vomit.  You cannot eat or drink without vomiting.  You have diarrhea.  You have pain when you urinate.  Your symptoms do not improve with treatment.  You develop new symptoms.  You develop excessive weakness. SEEK IMMEDIATE MEDICAL CARE IF:  You have shortness of breath or have trouble breathing.  You are dizzy or you faint.  You are disoriented or confused.  You develop signs of dehydration, such as a dry mouth, decreased urination, or paleness.  You develop  severe pain in your abdomen.  You have persistent vomiting or diarrhea.  You develop a skin rash.  Your symptoms suddenly get worse.   This information is not intended to replace advice given to you by your health care provider. Make sure you discuss any questions you have with your health care provider.   Document Released: 05/15/2001 Document Revised: 08/10/2015 Document Reviewed: 01/13/2015 Elsevier Interactive Patient Education 2016 Elsevier Inc.   Metatarsal Fracture A metatarsal fracture is a break in a metatarsal bone. Metatarsal bones connect your toe bones to your ankle bones. CAUSES This type of fracture may be caused by:  A sudden twisting of your foot.  A fall onto your foot.  Overuse or repetitive exercise. RISK FACTORS This condition is more likely to develop in people who:  Play contact sports.  Have a bone disease.  Have a low calcium level. SYMPTOMS Symptoms of this condition include:  Pain that is worse when walking or standing.  Pain when pressing on the foot or moving the toes.  Swelling.  Bruising on the top or bottom of the foot.  A foot that appears shorter than the other one. DIAGNOSIS This condition is diagnosed with a physical exam. You may also have imaging tests, such as:  X-rays.  A CT scan.  MRI. TREATMENT Treatment for this condition depends on its severity and whether a bone has moved out of place. Treatment may involve:  Rest.  Wearing foot support such as a cast, splint, or boot for several weeks.  Using crutches.  Surgery to move bones back into  the right position. Surgery is usually needed if there are many pieces of broken bone or bones that are very out of place (displaced fracture).  Physical therapy. This may be needed to help you regain full movement and strength in your foot. You will need to return to your health care provider to have X-rays taken until your bones heal. Your health care provider will look at  the X-rays to make sure that your foot is healing well. HOME CARE INSTRUCTIONS  If You Have a Cast:  Do not stick anything inside the cast to scratch your skin. Doing that increases your risk of infection.  Check the skin around the cast every day. Report any concerns to your health care provider. You may put lotion on dry skin around the edges of the cast. Do not apply lotion to the skin underneath the cast.  Keep the cast clean and dry. If You Have a Splint or a Supportive Boot:  Wear it as directed by your health care provider. Remove it only as directed by your health care provider.  Loosen it if your toes become numb and tingle, or if they turn cold and blue.  Keep it clean and dry. Bathing  Do not take baths, swim, or use a hot tub until your health care provider approves. Ask your health care provider if you can take showers. You may only be allowed to take sponge baths for bathing.  If your health care provider approves bathing and showering, cover the cast or splint with a watertight plastic bag to protect it from water. Do not let the cast or splint get wet. Managing Pain, Stiffness, and Swelling  If directed, apply ice to the injured area (if you have a splint, not a cast).  Put ice in a plastic bag.  Place a towel between your skin and the bag.  Leave the ice on for 20 minutes, 2-3 times per day.  Move your toes often to avoid stiffness and to lessen swelling.  Raise (elevate) the injured area above the level of your heart while you are sitting or lying down. Driving  Do not drive or operate heavy machinery while taking pain medicine.  Do not drive while wearing foot support on a foot that you use for driving. Activity  Return to your normal activities as directed by your health care provider. Ask your health care provider what activities are safe for you.  Perform exercises as directed by your health care provider or physical therapist. Safety  Do not use  the injured foot to support your body weight until your health care provider says that you can. Use crutches as directed by your health care provider. General Instructions  Do not put pressure on any part of the cast or splint until it is fully hardened. This may take several hours.  Do not use any tobacco products, including cigarettes, chewing tobacco, or e-cigarettes. Tobacco can delay bone healing. If you need help quitting, ask your health care provider.  Take medicines only as directed by your health care provider.  Keep all follow-up visits as directed by your health care provider. This is important. SEEK MEDICAL CARE IF:  You have a fever.  Your cast, splint, or boot is too loose or too tight.  Your cast, splint, or boot is damaged.  Your pain medicine is not helping.  You have pain, tingling, or numbness in your foot that is not going away. SEEK IMMEDIATE MEDICAL CARE IF:  You have severe  pain.  You have tingling or numbness in your foot that is getting worse.  Your foot feels cold or becomes numb.  Your foot changes color.   This information is not intended to replace advice given to you by your health care provider. Make sure you discuss any questions you have with your health care provider.   Document Released: 08/11/2002 Document Revised: 04/05/2015 Document Reviewed: 09/15/2014 Elsevier Interactive Patient Education 2016 Elsevier Inc.   Femoral Shaft Fracture A femoral shaft fracture is a break (fracture) in the shaft of the thigh bone (femur). The femur is the long bone that connects the hip joint to the knee joint. Most femoral shaft fractures are closed fractures. A closed fracture is a break in a bone that happens without any cuts (lacerations) through the skin that is near the fracture site. Some femoral shaft fractures are open fractures. An open fracture is a break in a bone that happens along with lacerations through the skin that is near the fracture  site.  CAUSES A healthy femur may break from a forceful impact, such as from:  A fall, especially from a great height.  A high-impact sports injury.  A car or motorcycle accident. A weakened femur may break from minimal impact or force due to:  Certain medical conditions.  Age. RISK FACTORS This condition is more likely to develop in:  Older people.  People who have certain medical conditions that cause bones to become weak or thin, such as:  Osteoporosis.  Cancer.  Osteogenesis imperfecta. This is a condition that involves bone weakness that is due to abnormal bone development.  People who take certain medicines (bisphosphonates) that are used to treat osteoporosis.  People who participate in high-risk sports or impact sports. SYMPTOMS Symptoms of this condition include:  Severe pain.  Inability to walk.  Bruising.  Swelling or visible deformity of the leg.  Substantial bleeding, if it is an open fracture. DIAGNOSIS This condition is diagnosed based on your symptoms and a physical exam. You may also have other tests, including:  X-rays of the femur. Since the force required to break a healthy femur can break other bones, X-rays are often taken of the hip, knee, and pelvis as well.  Evaluation of the blood vessels with specialized X-rays (arteriogram).  Evaluation of the nerves in the area of the break.  CT scan or MRI. TREATMENT A femoral shaft fracture usually requires surgery. You may have one or more of the following surgical treatments:  External fixation. This involves using pins and screws to hold the bones in place if there is extensive soft tissue injury. After some time, you may need an additional surgical treatment, such as intramedullary nailing.  Intramedullary nailing. This involves inserting a rod (intramedullary nail) through an incision. The intramedullary nail goes down the center of the shaft of the femur. It may be inserted in the knee  joint or from the top of the femur near the hip. Generally, screws are placed through the rod at both ends to prevent shortening or rotation of the femur as it heals.  Plates to stabilize the fracture. These may be used, especially when the fracture is at either end of the bone, near the hip or the knee. In rare cases for which surgery is not an option, a cast or a splint may be used to hold (immobilize) the bone while it heals. PREVENTION If factors such as certain medical conditions or age increase your risk for another femoral shaft fracture,  you may be able to prevent one if you follow these instructions:  Use aids for walking, such as a walker or cane, as directed by your health care provider.  Follow instructions from your health care provider about how to strengthen your bones if you have osteoporosis.   This information is not intended to replace advice given to you by your health care provider. Make sure you discuss any questions you have with your health care provider.   Document Released: 08/29/2005 Document Revised: 04/05/2015 Document Reviewed: 07/05/2014 Elsevier Interactive Patient Education [employer]2016 Elsevier Inc.

## 2016-08-29 ENCOUNTER — Ambulatory Visit (INDEPENDENT_AMBULATORY_CARE_PROVIDER_SITE_OTHER): Payer: Medicaid Other | Admitting: Family Medicine

## 2016-08-29 ENCOUNTER — Encounter: Payer: Self-pay | Admitting: Family Medicine

## 2016-08-29 ENCOUNTER — Ambulatory Visit: Payer: Medicaid Other | Admitting: Family Medicine

## 2016-08-29 VITALS — BP 110/78 | HR 72 | Temp 98.4°F | Wt 142.5 lb

## 2016-08-29 DIAGNOSIS — Z8781 Personal history of (healed) traumatic fracture: Secondary | ICD-10-CM | POA: Insufficient documentation

## 2016-08-29 DIAGNOSIS — Z87828 Personal history of other (healed) physical injury and trauma: Secondary | ICD-10-CM | POA: Insufficient documentation

## 2016-08-29 DIAGNOSIS — J069 Acute upper respiratory infection, unspecified: Secondary | ICD-10-CM | POA: Diagnosis not present

## 2016-08-29 MED ORDER — AZITHROMYCIN 250 MG PO TABS: ORAL_TABLET | ORAL | 0 refills | Status: DC

## 2016-08-29 MED ORDER — BENZONATATE 200 MG PO CAPS: 200.0000 mg | ORAL_CAPSULE | Freq: Three times a day (TID) | ORAL | 0 refills | Status: DC | PRN

## 2016-08-29 NOTE — Progress Notes (Signed)
Pre visit review using our clinic review tool, if applicable. No additional management support is needed unless otherwise documented below in the visit note. 
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
## 2016-08-29 NOTE — Patient Instructions (Signed)
Tessalon for cough.  Drink plenty of fluids, take tylenol as needed, and gargle with warm salt water for your throat.  This should gradually improve.  Take care.  Let us know if you have other concerns.   If not better in a few days, then start zithromax (antibiotic).

## 2016-09-05 ENCOUNTER — Encounter (HOSPITAL_COMMUNITY): Payer: Self-pay | Admitting: Emergency Medicine

## 2016-09-05 ENCOUNTER — Ambulatory Visit (INDEPENDENT_AMBULATORY_CARE_PROVIDER_SITE_OTHER): Payer: Self-pay

## 2016-09-05 ENCOUNTER — Ambulatory Visit (HOSPITAL_COMMUNITY)
Admission: EM | Admit: 2016-09-05 | Discharge: 2016-09-05 | Disposition: A | Discharge status: Home / Self Care | Payer: Self-pay | Attending: Family Medicine | Admitting: Family Medicine

## 2016-09-05 DIAGNOSIS — Z880 Allergy status to penicillin: Secondary | ICD-10-CM | POA: Insufficient documentation

## 2016-09-05 DIAGNOSIS — Z79899 Other long term (current) drug therapy: Secondary | ICD-10-CM | POA: Insufficient documentation

## 2016-09-05 DIAGNOSIS — M79652 Pain in left thigh: Secondary | ICD-10-CM

## 2016-09-05 DIAGNOSIS — Z9889 Other specified postprocedural states: Secondary | ICD-10-CM | POA: Insufficient documentation

## 2016-09-05 DIAGNOSIS — M79605 Pain in left leg: Secondary | ICD-10-CM | POA: Insufficient documentation

## 2016-09-05 DIAGNOSIS — J029 Acute pharyngitis, unspecified: Secondary | ICD-10-CM | POA: Insufficient documentation

## 2016-09-05 DIAGNOSIS — F17210 Nicotine dependence, cigarettes, uncomplicated: Secondary | ICD-10-CM | POA: Insufficient documentation

## 2016-09-05 DIAGNOSIS — J45909 Unspecified asthma, uncomplicated: Secondary | ICD-10-CM | POA: Insufficient documentation

## 2016-09-05 LAB — POCT RAPID STREP A: STREPTOCOCCUS, GROUP A SCREEN (DIRECT): NEGATIVE

## 2016-09-05 MED ORDER — HYDROCODONE-ACETAMINOPHEN 5-325 MG PO TABS: 1.0000 | ORAL_TABLET | Freq: Four times a day (QID) | ORAL | 0 refills | Status: DC | PRN

## 2016-09-05 MED ORDER — HYDROCODONE-ACETAMINOPHEN 5-325 MG PO TABS: 1.0000 | ORAL_TABLET | Freq: Four times a day (QID) | ORAL | 0 refills | Status: AC | PRN

## 2016-09-05 NOTE — ED Triage Notes (Signed)
The patient also complained of left thigh pain secondary to a previous femur fracture and surgery from February of 2017.

## 2016-09-05 NOTE — Discharge Instructions (Signed)
Your strep test is negative today. We send your specimen off for a throat culture, we will only call you if your culture comes back positive for strep.   Your leg xray is normal today. Please follow up with Dr. Magnus IvanBlackman if your leg pain does not improve. He should have access to the xray result.

## 2016-09-05 NOTE — ED Triage Notes (Signed)
The patient presented to the [facility] with a complaint of a sore throat that started 2 days ago. The patient stated that he was evaluated by his PCP 8 days ago and given a prescription for Zithromax and told to take them if it got worse and that his throat "looked ok." The patient stated that he started the Zithromax today. The patient also reported that he had a fever last night.

## 2016-09-05 NOTE — ED Provider Notes (Signed)
CSN: [id number]     Arrival date & time 09/05/16  1537 History   None    Chief Complaint  Patient presents with  . Sore Throat  . Leg Pain   (Consider location/radiation/quality/duration/timing/severity/associated sxs/prior Treatment) Patient is a 20 y.o male, presents today for sore throat. He started getting sick 10 days ago with URI symptoms including sore throat and was evaluated by his PCP and was given Z-pack; but instructed to only start the z-pack if he does not improve. Patient's symptom did not improve so he started his Z-pack yesterday. He reports the sore throat bothers him the most. He reports sore throat pain 6/10. He reports that his PCP did not check him for strep.  Patient also reports left leg pain. He had a fracture of his left femur shaft in 03/2016 and was surgical fixed with intramedullary nails. Patient reports intermittent pain since the injury however the pain noticed to be worst in the past few weeks. He reports the pain as aching. Pain seems to be worst with weather changes. His orthopedic is Dr. Magnus IvanBlackman, who also operated on him.       Past Medical History:  Diagnosis Date  . Asthma   . Asthma    childhood   Past Surgical History:  Procedure Laterality Date  . FEMUR IM NAIL Left 03/06/2016   Procedure: INTRAMEDULLARY (IM) NAIL FEMORAL LEFT;  Surgeon: Kathryne Hitchhristopher Y Blackman, MD;  Location: [facility] OR;  Service: Orthopedics;  Laterality: Left;  . INCISION AND DRAINAGE OF WOUND Right 03/06/2016   Procedure: IRRIGATION AND DEBRIDEMENT RIGHT FOOT WOUND AND LACERATION CLOSURE;  Surgeon: Kathryne Hitchhristopher Y Blackman, MD;  Location: [facility] OR;  Service: Orthopedics;  Laterality: Right;   Family History  Problem Relation Age of Onset  . Fibromyalgia Mother   . Hypertension Father   . Gout Father    Social History  Substance Use Topics  . Smoking status: Current Every Day Smoker    Years: 2.00    Types: Cigarettes  . Smokeless tobacco: Never Used     Comment: 1-2 cigarettes  a day  . Alcohol use No    Review of Systems  Constitutional: Positive for fever. Negative for chills and fatigue.  HENT: Positive for congestion and sore throat. Negative for rhinorrhea, sinus pressure and sneezing.   Respiratory: Negative for cough and shortness of breath.   Cardiovascular: Negative for chest pain.  Gastrointestinal: Negative for abdominal pain, diarrhea, nausea and vomiting.  Musculoskeletal:       Left upper leg pain  Neurological: Negative for dizziness, weakness and headaches.    Allergies  Penicillins and Penicillins  Home Medications   Prior to Admission medications   Medication Sig Start Date End Date Taking? Authorizing Provider  azithromycin (ZITHROMAX) 250 MG tablet 2 tabs a day for 1 day and then 1 a day for 4 days. 08/29/16  Yes Joaquim NamGraham S Duncan, MD  benzonatate (TESSALON) 200 MG capsule Take 1 capsule (200 mg total) by mouth 3 (three) times daily as needed for cough. 08/29/16  Yes Joaquim NamGraham S Duncan, MD   Meds Ordered and Administered this Visit  Medications - No data to display  BP 117/75 (BP Location: Left Arm)   Pulse 80   Temp 99.7 F (37.6 C) (Oral)   Resp 18   SpO2 100%  No data found.   Physical Exam  Constitutional: He is oriented to person, place, and time. He appears well-developed and well-nourished.  HENT:  Head: Normocephalic and atraumatic.  Right  Ear: External ear normal.  Left Ear: External ear normal.  Nose: Nose normal.  Mouth/Throat: Oropharynx is clear and moist.  TM pearly gray with no erythema. Pharynx and tonsils are red with no swelling or excudate.  Eyes: EOM are normal. Pupils are equal, round, and reactive to light.  Neck: Normal range of motion. Neck supple.  Cardiovascular: Normal rate, regular rhythm and normal heart sounds.   Pulmonary/Chest: Effort normal and breath sounds normal. No respiratory distress. He has no wheezes.  Abdominal: Soft. Bowel sounds are normal. He exhibits no distension. There is no  tenderness.  Musculoskeletal: Normal range of motion. He exhibits no edema, tenderness or deformity.  Surgical scars noted on left upper leg  Lymphadenopathy:    He has no cervical adenopathy.  Neurological: He is alert and oriented to person, place, and time.  Nursing note and vitals reviewed.   Urgent Care Course   Clinical Course    Procedures (including critical care time)  Labs Review Labs Reviewed  POCT RAPID STREP A    Imaging Review Dg Femur Min 2 Views Left  Result Date: 09/05/2016 CLINICAL DATA:  Distal left femoral pain for 2 weeks. No acute injury. History of left femur fracture 6 months ago. EXAM: LEFT FEMUR 2 VIEWS COMPARISON:  Radiographs 03/06/2016 and 03/07/2016. FINDINGS: Status post left femoral intramedullary nail fixation with proximal and distal interlocking screws. The hardware is intact without loosening. The previously demonstrated fracture of the proximal left femoral diaphysis has healed with mild posttraumatic deformity. No evidence of acute fracture or dislocation. No heterotopic ossification identified. IMPRESSION: Interval healing of left femur fracture.  No acute osseous findings. Electronically Signed   By: Carey Bullocks M.D.   On: 09/05/2016 17:07     MDM   1. Sore throat   2. Pain of left thigh    1) Rapid strep negative. Culture is pending. Continue to take the antibiotic. Salt water gargle, honey, throat lozenges for sore throat. May also try chloraseptic or cepacol lozenges as well. Follow up with your primary care provider if you do improve.   2) Xray of the left femur show interval healing of the left femur fracture with no acute osseous finding. Patient will follow up with Dr. Magnus Ivan if the pain continues to get worst or if the pain does not improve.   3) Patient denies any questions. Requesting something for pain. Rx for hydrocodone-APAP 5-325mg  given. [place] Controlled substances reviewed and is appropriate. Discharge paperwork given.         Lucia Estelle, NP 09/05/16 1731

## 2016-09-06 ENCOUNTER — Ambulatory Visit: Payer: Medicaid Other | Admitting: Family Medicine

## 2016-09-07 LAB — CULTURE, GROUP A STREP (THRC)

## 2016-09-14 ENCOUNTER — Ambulatory Visit (INDEPENDENT_AMBULATORY_CARE_PROVIDER_SITE_OTHER): Payer: Self-pay | Admitting: Family Medicine

## 2016-09-14 ENCOUNTER — Encounter: Payer: Self-pay | Admitting: Family Medicine

## 2016-09-14 VITALS — BP 120/80 | HR 82 | Temp 98.4°F | Wt 142.5 lb

## 2016-09-14 DIAGNOSIS — M79671 Pain in right foot: Secondary | ICD-10-CM

## 2016-09-14 NOTE — Progress Notes (Signed)
   Subjective:    Patient ID: Clayton Walker, male    DOB: [birth year], 20 y.o.   MRN: [id number]  HPI This is a 20 yo male, accompanied by a friend, who presents today with right foot pain x 2 days. He was running outside when he jumped and landed wrong on his foot. He was barefoot. Fell over after he landed due to pain. Pain is on outside of bottom of foot on lateral side. He has taken Alleve 2 a day without relief. No ice or compression.   Past Medical History:  Diagnosis Date  . Asthma   . Asthma    childhood   Past Surgical History:  Procedure Laterality Date  . FEMUR IM NAIL Left 03/06/2016   Procedure: INTRAMEDULLARY (IM) NAIL FEMORAL LEFT;  Surgeon: Kathryne Hitchhristopher Y Blackman, MD;  Location: [facility] OR;  Service: Orthopedics;  Laterality: Left;  . INCISION AND DRAINAGE OF WOUND Right 03/06/2016   Procedure: IRRIGATION AND DEBRIDEMENT RIGHT FOOT WOUND AND LACERATION CLOSURE;  Surgeon: Kathryne Hitchhristopher Y Blackman, MD;  Location: [facility] OR;  Service: Orthopedics;  Laterality: Right;   Family History  Problem Relation Age of Onset  . Fibromyalgia Mother   . Hypertension Father   . Gout Father    Social History  Substance Use Topics  . Smoking status: Current Every Day Smoker    Years: 2.00    Types: Cigarettes  . Smokeless tobacco: Never Used     Comment: 1-2 cigarettes a day  . Alcohol use No       Review of Systems Per HPI    Objective:   Physical Exam  Constitutional: He appears well-developed and well-nourished. No distress.  HENT:  Head: Normocephalic and atraumatic.  Eyes: Conjunctivae are normal.  Cardiovascular: Normal rate.   Pulmonary/Chest: Effort normal.  Musculoskeletal:       Feet:  Skin: He is not diaphoretic.  Vitals reviewed.     BP 120/80   Pulse 82   Temp 98.4 F (36.9 C) (Oral)   Wt 142 lb 8 oz (64.6 kg)   SpO2 98%   BMI 19.87 kg/m  Wt Readings from Last 3 Encounters:  09/14/16 142 lb 8 oz (64.6 kg)  08/29/16 142 lb 8 oz (64.6 kg)    03/08/16 150 lb (68 kg)       Assessment & Plan:  1. Pain of right heel - provided instructions regarding otc analgesic use, ROM BID, wrap for comfort, avoid over use, wear shoes for support - RTC if no improvement in 5-7 days   Olean Reeeborah Keondre Markson, FNP-BC  [facility]  09/14/2016 5:13 PM

## 2016-09-14 NOTE — Patient Instructions (Signed)
I think you have strained the back/ bottom of your foot Please keep wrapped during the day for comfort Do range of motion exercises twice a day- write the letters of the alphabet with your foot to help strengthen and maintain flexibility Wear your shoes for support You can take Alleve 2 tablets twice a day, and tylenol 2 tablets 3 times a day as need for pain If not better in 7-10 days, please come back in for a recheck

## 2018-01-18 ENCOUNTER — Ambulatory Visit (HOSPITAL_COMMUNITY)
Admission: EM | Admit: 2018-01-18 | Discharge: 2018-01-18 | Disposition: A | Discharge status: Home / Self Care | Payer: Self-pay | Attending: Family Medicine | Admitting: Family Medicine

## 2018-01-18 ENCOUNTER — Encounter (HOSPITAL_COMMUNITY): Payer: Self-pay | Admitting: Emergency Medicine

## 2018-01-18 DIAGNOSIS — R69 Illness, unspecified: Secondary | ICD-10-CM

## 2018-01-18 DIAGNOSIS — J111 Influenza due to unidentified influenza virus with other respiratory manifestations: Secondary | ICD-10-CM

## 2018-01-18 MED ORDER — HYDROCODONE-HOMATROPINE 5-1.5 MG/5ML PO SYRP: 5.0000 mL | ORAL_SOLUTION | Freq: Four times a day (QID) | ORAL | 0 refills | Status: DC | PRN

## 2018-01-18 NOTE — ED Triage Notes (Signed)
PT C/O: cold sx onset 3 days associated w/dry cough, fevers, PND, nasal drainage, body aches, decreased appetite  TAKING MEDS: Dayquil   A&O x4... NAD... Ambulatory

## 2018-01-18 NOTE — Discharge Instructions (Signed)

## 2018-01-18 NOTE — ED Provider Notes (Signed)
[facility]   [id number] 01/18/18 Arrival Time: 1835  ASSESSMENT & PLAN:  1. Influenza-like illness     Meds ordered this encounter  Medications  . HYDROcodone-homatropine (HYCODAN) 5-1.5 MG/5ML syrup    Sig: Take 5 mLs by mouth every 6 (six) hours as needed for cough.    Dispense:  90 mL    Refill:  0   Work note given. Cough medication sedation precautions. Discussed typical duration of symptoms. OTC symptom care as needed. Ensure adequate fluid intake and rest. May f/u with PCP or here as needed.  Reviewed expectations re: course of current medical issues. Questions answered. Outlined signs and symptoms indicating need for more acute intervention. Patient verbalized understanding. After Visit Summary given.   SUBJECTIVE: History from: patient.  Clayton Dominica SeverinLee Walker Walker is a 22 y.o. male who presents with complaint of nasal congestion, post-nasal drainage, and a persistent dry cough. Onset abrupt, approximately 3 days ago. Overall fatigued with body aches. SOB: none. Wheezing: none. Fever: yes. Overall normal PO intake without n/v. Sick contacts: yes, co-workers. OTC treatment: cough medication without relief.  Received flu shot this year: yes.  Social History   Tobacco Use  Smoking Status Current Every Day Smoker  . Years: 2.00  . Types: Cigarettes  Smokeless Tobacco Never Used  Tobacco Comment   1-2 cigarettes a day    ROS: As per HPI.   OBJECTIVE:  Vitals:   01/18/18 1914  BP: (!) 130/92  Pulse: (!) 104  Resp: 20  Temp: 100.1 F (37.8 C)  TempSrc: Oral  SpO2: 97%     General appearance: alert; appears fatigued HEENT: nasal congestion; clear runny nose; throat irritation secondary to post-nasal drainage Neck: supple without LAD Lungs: unlabored respirations, symmetrical air entry; cough: moderate; no respiratory distress Skin: warm and dry Psychological: alert and cooperative; normal mood and affect   Allergies  Allergen  Reactions  . Penicillins     hives  . Penicillins Hives    Reaction as a toddler Has patient had a PCN reaction causing immediate rash, facial/tongue/throat swelling, SOB or lightheadedness with hypotension: possible hives - pt not absolutely certain Has patient had a PCN reaction causing severe rash involving mucus membranes or skin necrosis: No Has patient had a PCN reaction that required hospitalization No Has patient had a PCN reaction occurring within the last 10 years: No If all of the above answers are "NO", then may proceed with Cephalosporin use.    Past Medical History:  Diagnosis Date  . Asthma   . Asthma    childhood   Family History  Problem Relation Age of Onset  . Fibromyalgia Mother   . Hypertension Father   . Gout Father    Social History   Socioeconomic History  . Marital status: Single    Spouse name: Not on file  . Number of children: Not on file  . Years of education: Not on file  . Highest education level: Not on file  Social Needs  . Financial resource strain: Not on file  . Food insecurity - worry: Not on file  . Food insecurity - inability: Not on file  . Transportation needs - medical: Not on file  . Transportation needs - non-medical: Not on file  Occupational History  . Not on file  Tobacco Use  . Smoking status: Current Every Day Smoker    Years: 2.00    Types: Cigarettes  . Smokeless tobacco: Never Used  . Tobacco comment: 1-2 cigarettes a  day  Substance and Sexual Activity  . Alcohol use: No    Alcohol/week: 0.0 oz  . Drug use: No  . Sexual activity: Not on file  Other Topics Concern  . Not on file  Social History Narrative   ** Merged History Encounter **               Mardella Layman, MD 01/18/18 [phone]

## 2018-04-23 IMAGING — DX DG FEMUR 2+V*L*
4 series · 4 of 4 positions shown · non-contrast
Comparison: Radiographs 03/06/2016 and 03/07/2016.

CLINICAL DATA: Distal left femoral pain for 2 weeks. No acute
injury. History of left femur fracture 6 months ago.

EXAM:
LEFT FEMUR 2 VIEWS

[femur ap (1 of 2)]
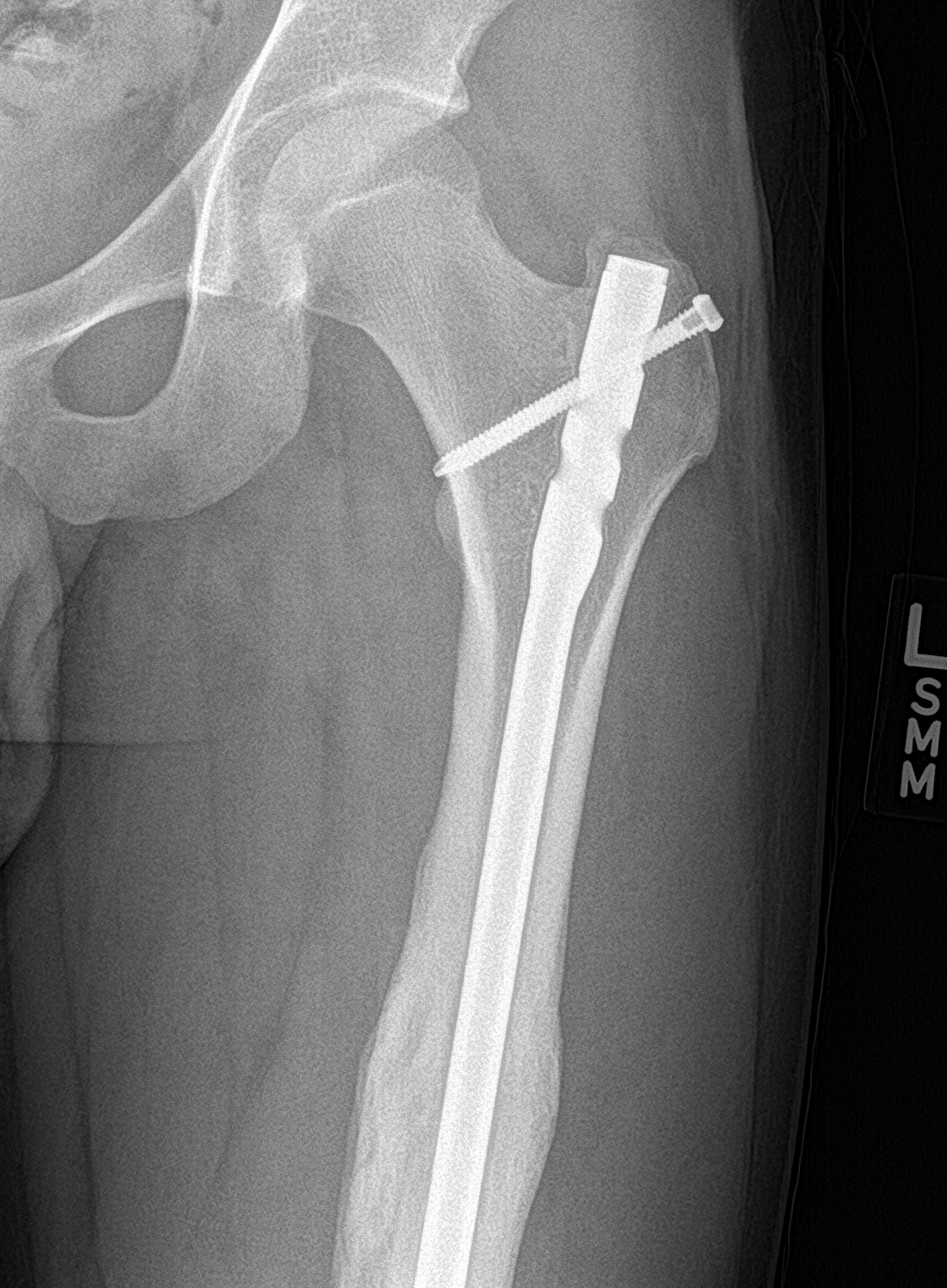

[femur ap (2 of 2)]
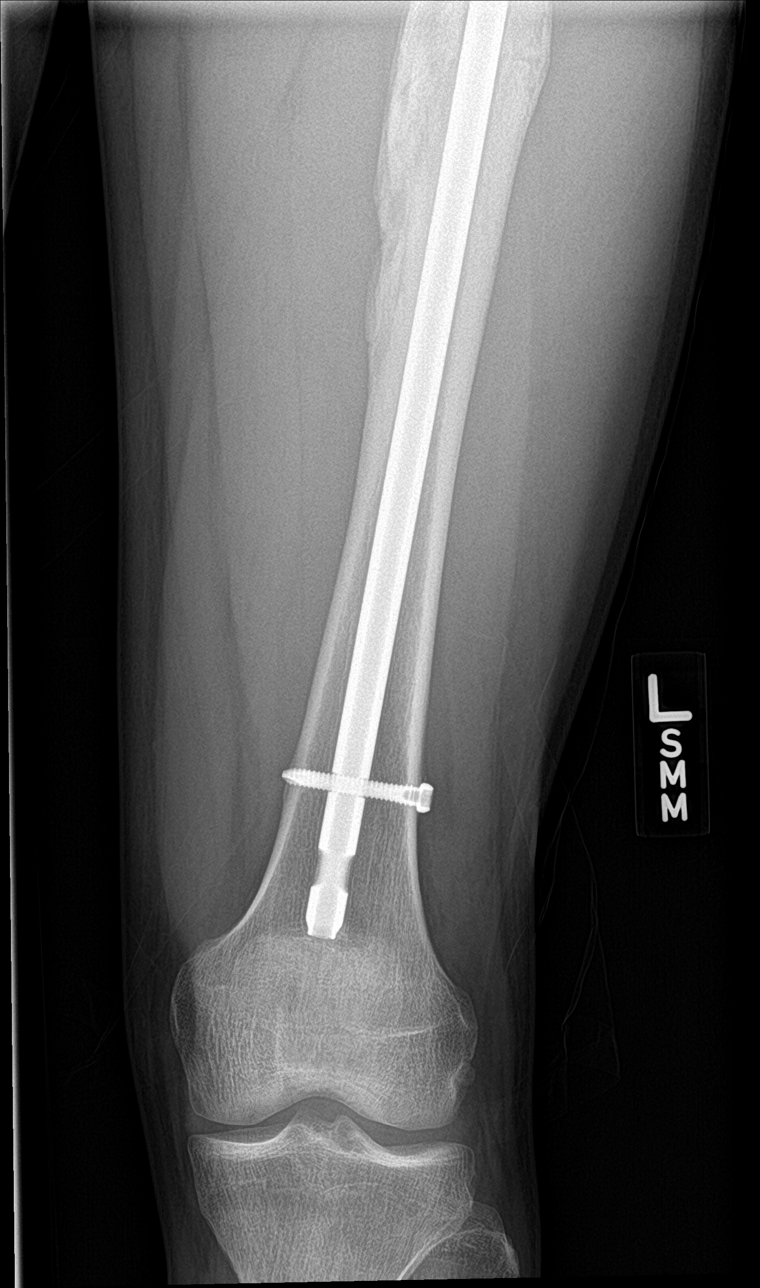

[femur lat (1 of 2)]
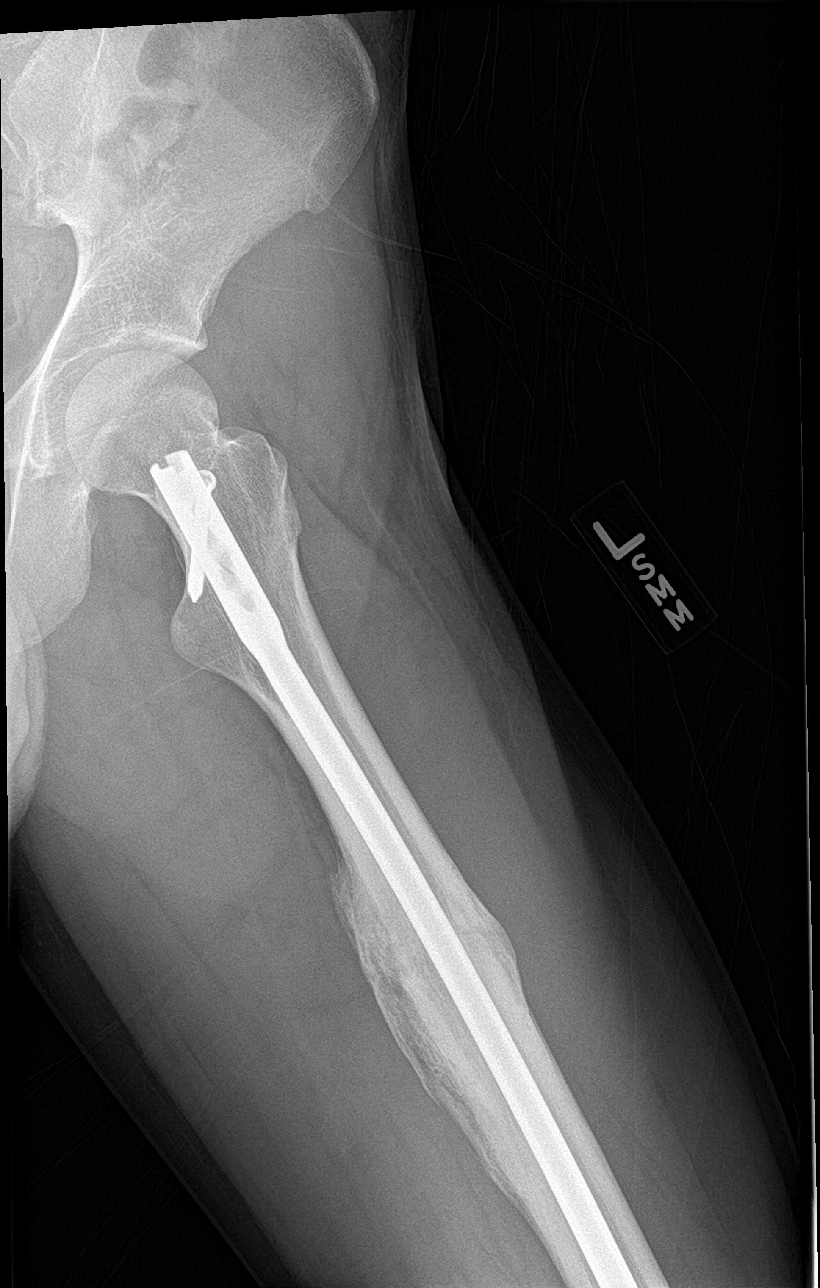

[femur lat (2 of 2)]
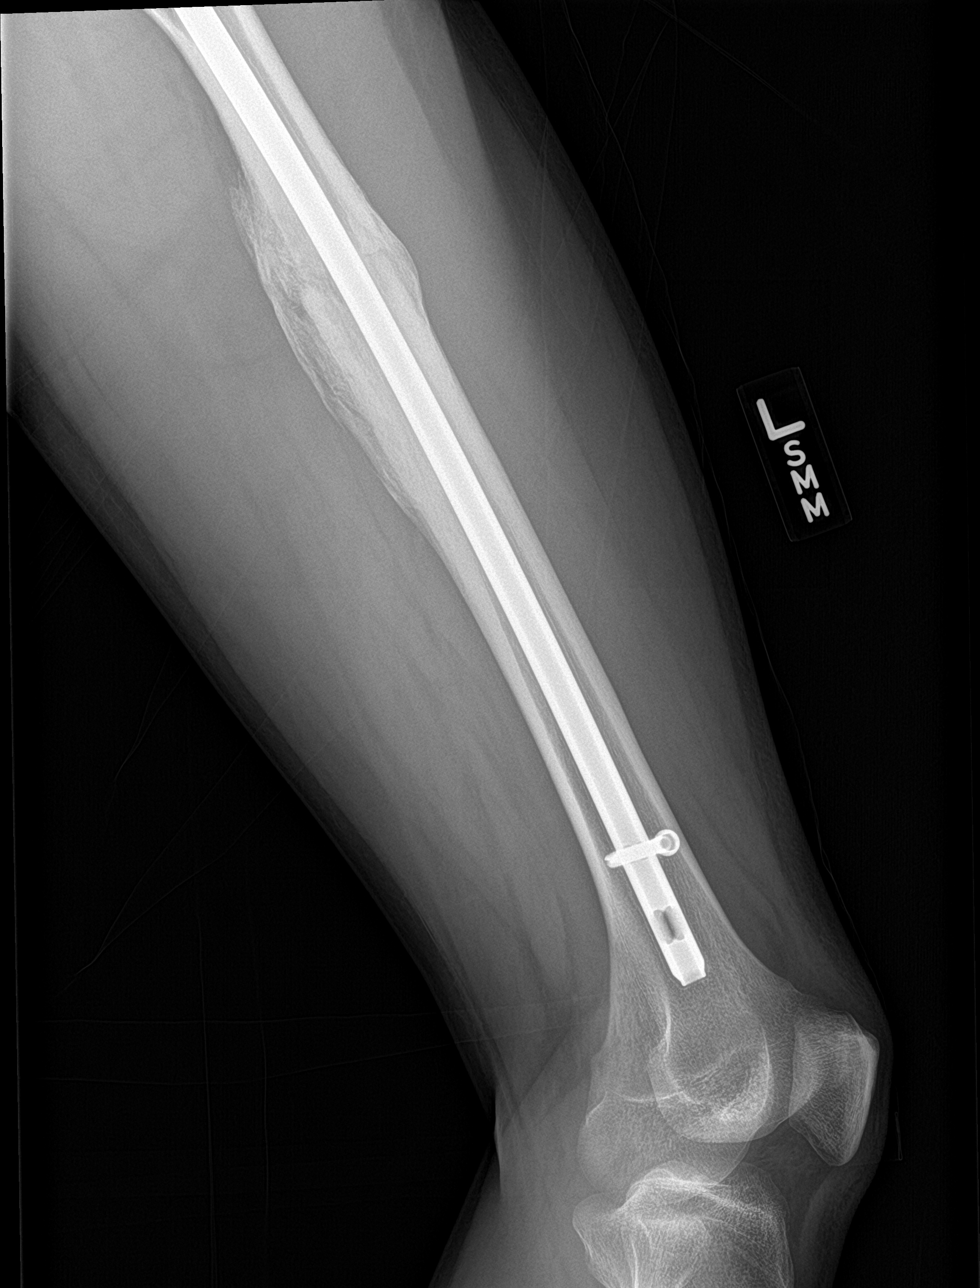

[4 of 4 positions shown; findings below may reference images not displayed]

FINDINGS: Status post left femoral intramedullary nail fixation with proximal
and distal interlocking screws. The hardware is intact without
loosening. The previously demonstrated fracture of the proximal left
femoral diaphysis has healed with mild posttraumatic deformity. No
evidence of acute fracture or dislocation. No heterotopic
ossification identified.
IMPRESSION: Interval healing of left femur fracture.  No acute osseous findings.

## 2018-09-26 ENCOUNTER — Ambulatory Visit (HOSPITAL_COMMUNITY)
Admission: EM | Admit: 2018-09-26 | Discharge: 2018-09-26 | Disposition: A | Discharge status: Home / Self Care | Payer: Self-pay | Attending: Physician Assistant | Admitting: Physician Assistant

## 2018-09-26 ENCOUNTER — Other Ambulatory Visit: Payer: Self-pay

## 2018-09-26 ENCOUNTER — Encounter (HOSPITAL_COMMUNITY): Payer: Self-pay

## 2018-09-26 DIAGNOSIS — M542 Cervicalgia: Secondary | ICD-10-CM

## 2018-09-26 DIAGNOSIS — M546 Pain in thoracic spine: Secondary | ICD-10-CM

## 2018-09-26 MED ORDER — CYCLOBENZAPRINE HCL 5 MG PO TABS: 5.0000 mg | ORAL_TABLET | Freq: Every evening | ORAL | 0 refills | Status: DC | PRN

## 2018-09-26 MED ORDER — MELOXICAM 7.5 MG PO TABS: 7.5000 mg | ORAL_TABLET | Freq: Every day | ORAL | 0 refills | Status: DC

## 2018-09-26 NOTE — Discharge Instructions (Signed)
Start Mobic. Do not take ibuprofen (motrin/advil)/ naproxen (aleve) while on mobic. Flexeril as needed at night. Flexeril can make you drowsy, so do not take if you are going to drive, operate heavy machinery, or make important decisions. Ice/heat compresses as needed. This can take up to 3-4 weeks to completely resolve, but you should be feeling better each week. Follow up here or with PCP if symptoms worsen, changes for reevaluation. If experience numbness of the fingers, loss of grip strength, follow up for reevaluation needed.  Other symptomatic treatment: you can get over the counter lidocaine patches, salonpas patches to help with symptoms.

## 2018-09-26 NOTE — ED Triage Notes (Signed)
Pt c/o neck pain x2 days 
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
## 2020-03-12 ENCOUNTER — Ambulatory Visit: Payer: Self-pay | Attending: Internal Medicine

## 2020-03-12 DIAGNOSIS — Z23 Encounter for immunization: Secondary | ICD-10-CM

## 2020-03-12 NOTE — Progress Notes (Signed)
   Covid-19 Vaccination Clinic  Name:  Ugochukwu Chichester    MRN: [id number] DOB: 1996-01-30  03/12/2020  Mr. Moncrieffe was observed post Covid-19 immunization for 15 minutes without incident. He was provided with Vaccine Information Sheet and instruction to access the V-Safe system.   Mr. Spradley was instructed to call 911 with any severe reactions post vaccine: Marland Kitchen Difficulty breathing  . Swelling of face and throat  . A fast heartbeat  . A bad rash all over body  . Dizziness and weakness   Immunizations Administered    Name Date Dose VIS Date Route   Pfizer COVID-19 Vaccine 03/12/2020  3:57 PM 0.3 mL 11/13/2019 Intramuscular   Manufacturer: [employer], [employer]   Lot: [id number]   NDC: [address]-8

## 2020-04-04 ENCOUNTER — Ambulatory Visit: Payer: Self-pay | Attending: Internal Medicine

## 2022-05-02 ENCOUNTER — Encounter (HOSPITAL_COMMUNITY): Payer: Self-pay

## 2022-05-02 ENCOUNTER — Other Ambulatory Visit: Payer: Self-pay

## 2022-05-02 ENCOUNTER — Emergency Department (HOSPITAL_COMMUNITY)
Admission: EM | Admit: 2022-05-02 | Discharge: 2022-05-02 | Disposition: A | Discharge status: Home / Self Care | Payer: Self-pay | Attending: Emergency Medicine | Admitting: Emergency Medicine

## 2022-05-02 DIAGNOSIS — R2242 Localized swelling, mass and lump, left lower limb: Secondary | ICD-10-CM | POA: Insufficient documentation

## 2022-05-02 DIAGNOSIS — R229 Localized swelling, mass and lump, unspecified: Secondary | ICD-10-CM

## 2022-05-02 DIAGNOSIS — M25552 Pain in left hip: Secondary | ICD-10-CM | POA: Insufficient documentation

## 2022-05-02 NOTE — ED Notes (Signed)
Pt ambulatory without assistance.  

## 2022-05-02 NOTE — ED Provider Notes (Signed)
[facility] Provider Note   CSN: [id number] Arrival date & time: 05/02/22  0947     History  Chief Complaint  Patient presents with   Muscle Pain    Clayton Walker is a 26 y.o. male.  The history is provided by the patient and medical records. No language interpreter was used.  Muscle Pain   26 year old male with significant history of scoliosis, leg length discrepancy, who presents for evaluation of left hip pain.  Patient reported last night he was laying in bed, and he felt some discomfort to his left hip.  Upon inspection he noticed swelling noted to the left hip.  He described pain as a mild cramping sensation, rates as 4 out of 10, nonradiating without any associated fever chills body aches groin pain or any recent injury.  He have not noticed this before.  No complaints of chest pain or trouble breathing.  Report having left hip surgery 4 years ago when he broke his hip.  He has been able to ambulate without difficulty.  Denies any specific treatment tried.    Home Medications Prior to Admission medications   Medication Sig Start Date End Date Taking? Authorizing Provider  cyclobenzaprine (FLEXERIL) 5 MG tablet Take 1 tablet (5 mg total) by mouth at bedtime as needed for muscle spasms. 09/26/18   Cathie Hoops, Amy V, PA-C  HYDROcodone-homatropine (HYCODAN) 5-1.5 MG/5ML syrup Take 5 mLs by mouth every 6 (six) hours as needed for cough. 01/18/18   Mardella Layman, MD  meloxicam (MOBIC) 7.5 MG tablet Take 1 tablet (7.5 mg total) by mouth daily. 09/26/18   Cathie Hoops, Amy V, PA-C      Allergies    Penicillins and Penicillins    Review of Systems   Review of Systems  All other systems reviewed and are negative.  Physical Exam Updated Vital Signs BP 122/81 (BP Location: Left Arm)   Pulse 97   Temp 98.3 F (36.8 C) (Oral)   Resp 18   Ht 5\' 11"  (1.803 m)   Wt 63.5 kg   SpO2 96%   BMI 19.53 kg/m  Physical Exam Vitals and nursing note  reviewed.  Constitutional:      General: He is not in acute distress.    Appearance: He is well-developed.  HENT:     Head: Atraumatic.  Eyes:     Conjunctiva/sclera: Conjunctivae normal.  Musculoskeletal:        General: Tenderness (Left hip:  lateral aspect of the hip is a firm subcutaneous mobile mass approximately 5 cm in diameter that is nontender to palpation.  No erythema or warmth appreciated.  There is a surgical scar overlying this area with normal appearance.  L hip w FROM) present.     Cervical back: Neck supple.  Skin:    Capillary Refill: Capillary refill takes less than 2 seconds.     Findings: No rash.  Neurological:     Mental Status: He is alert.    ED Results / Procedures / Treatments   Labs (all labs ordered are listed, but only abnormal results are displayed) Labs Reviewed - No data to display  EKG None  Radiology No results found.  Procedures Procedures    Medications Ordered in ED Medications - No data to display  ED Course/ Medical Decision Making/ A&P                           Medical Decision Making  BP 122/81 (BP Location: Left Arm)   Pulse 97   Temp 98.3 F (36.8 C) (Oral)   Resp 18   Ht 5\' 11"  (1.803 m)   Wt 63.5 kg   SpO2 96%   BMI 19.53 kg/m   7:15 AM This is a 26 year old male presenting with complaints of swelling noted to his left hip that he first noticed it last night.  He also reported having prior left hip surgery when he broke his hip approximate 4 years ago.  He denies any recent trauma.  On exam, he has a 6 cm from mobile subcutaneous nodule noted to the lateral aspect of the hip underneath a previous surgical scar.  This area is nontender to palpation and his hip with full range of motion.  Left back is not shortened or externally rotated.  No evidence to suggest cellulitis or abscess.  This area felt like a lipoma but appears less likely given the acute onset of it.  I have consider muscle tear/muscle strain.  Low  suspicion for pseudoaneurysm, this is not consistent with an inguinal hernia, doubt hip dislocation or fracture. Doubt malignancy. Patient is neurovascular intact.  Ambulate without difficulty.  He may benefit from outpatient eval by orthopedist and advanced imaging may be indicated however with lack of significant pain and lack of signs of infection, will deferred further evaluation to outpatient.  Patient voiced understanding and agrees to plan.  Recommend ibuprofen or Tylenol as needed for pain.  I have considered x-ray but felt that it is low yield at this time.        Final Clinical Impression(s) / ED Diagnoses Final diagnoses:  Subcutaneous mass    Rx / DC Orders ED Discharge Orders     None         30, PA-C 05/02/22 05/04/22    [id number], MD 05/02/22 [phone]

## 2022-05-02 NOTE — ED Notes (Signed)
Pt left without receiving discharge instructions. ?

## 2022-05-02 NOTE — ED Triage Notes (Addendum)
Pt was stretching this morning and after,  noticed that his left hip muscle knotted up into a ball.

## 2022-05-02 NOTE — Discharge Instructions (Signed)
You have been evaluated for your hip discomfort.  The swelling of your hip has the appearance of a lipoma which is a benign fatty tissue.  However, if it causes discomfort or becoming larger, please follow up closely with orthopedist for further evaluation.  You may benefit from a CT or MRI for further assessment.  Take over the counter tylenol or ibuprofen as needed for pain.  If it becomes red, inflame, or worsen do not hesitate to return for further evaluation.

## 2022-11-04 ENCOUNTER — Emergency Department (HOSPITAL_COMMUNITY): Payer: Medicaid Other

## 2022-11-04 ENCOUNTER — Emergency Department (HOSPITAL_COMMUNITY)
Admission: EM | Admit: 2022-11-04 | Discharge: 2022-11-04 | Disposition: A | Discharge status: Home / Self Care | Payer: Medicaid Other | Attending: Emergency Medicine | Admitting: Emergency Medicine

## 2022-11-04 DIAGNOSIS — W19XXXA Unspecified fall, initial encounter: Secondary | ICD-10-CM

## 2022-11-04 DIAGNOSIS — M25511 Pain in right shoulder: Secondary | ICD-10-CM | POA: Diagnosis not present

## 2022-11-04 DIAGNOSIS — S0990XA Unspecified injury of head, initial encounter: Secondary | ICD-10-CM | POA: Diagnosis not present

## 2022-11-04 DIAGNOSIS — Y990 Civilian activity done for income or pay: Secondary | ICD-10-CM | POA: Insufficient documentation

## 2022-11-04 DIAGNOSIS — W01198A Fall on same level from slipping, tripping and stumbling with subsequent striking against other object, initial encounter: Secondary | ICD-10-CM | POA: Insufficient documentation

## 2022-11-04 MED ORDER — MELOXICAM 7.5 MG PO TABS: 7.5000 mg | ORAL_TABLET | Freq: Every day | ORAL | 0 refills | Status: DC

## 2022-11-04 MED ORDER — METHOCARBAMOL 500 MG PO TABS: 500.0000 mg | ORAL_TABLET | Freq: Two times a day (BID) | ORAL | 0 refills | Status: DC

## 2022-11-04 NOTE — ED Triage Notes (Signed)
Patient arrived stating today at work he slipped and fell causing him to hit his head at 5-530pm. Reports losing consciousness for a few seconds. Complaints of right shoulder pain and headache.

## 2022-11-04 NOTE — ED Provider Notes (Signed)
[facility] [place] [facility] Provider Note   CSN: [id number] Arrival date & time: 11/04/22  1925     History  Chief Complaint  Patient presents with   Fall    Clayton Walker is a 26 y.o. male.  Patient with noncontributory past medical history presents today with complaints of fall.  He states that same occurred immediately prior to arrival today when he slipped at work and hit the side of his right head on concrete.  States that he thinks he temporarily lost consciousness.  Endorses pain to the right side of his head and shoulder.  He has been able to ambulate since the fall without difficulty.  Denies any fevers, chills, neck pain, blurred vision, nausea, or vomiting.  He is not anticoagulated.  The history is provided by the patient. No language interpreter was used.  Fall       Home Medications Prior to Admission medications   Medication Sig Start Date End Date Taking? Authorizing Provider  cyclobenzaprine (FLEXERIL) 5 MG tablet Take 1 tablet (5 mg total) by mouth at bedtime as needed for muscle spasms. 09/26/18   Cathie Hoops, Amy V, PA-C  HYDROcodone-homatropine (HYCODAN) 5-1.5 MG/5ML syrup Take 5 mLs by mouth every 6 (six) hours as needed for cough. 01/18/18   Mardella Layman, MD  meloxicam (MOBIC) 7.5 MG tablet Take 1 tablet (7.5 mg total) by mouth daily. 09/26/18   Cathie Hoops, Amy V, PA-C      Allergies    Penicillins and Penicillins    Review of Systems   Review of Systems  Musculoskeletal:  Positive for arthralgias.  All other systems reviewed and are negative.   Physical Exam Updated Vital Signs BP 116/68 (BP Location: Left Arm)   Pulse 97   Temp 98.3 F (36.8 C) (Oral)   Resp 20   SpO2 100%  Physical Exam Vitals and nursing note reviewed.  Constitutional:      General: He is not in acute distress.    Appearance: Normal appearance. He is normal weight. He is not ill-appearing, toxic-appearing or diaphoretic.  HENT:     Head: Normocephalic  and atraumatic.     Comments: No Battle sign or raccoon eyes.  No hemotympanum.  No hematoma noted to the head.    Nose: Nose normal.     Mouth/Throat:     Mouth: Mucous membranes are moist.  Eyes:     Extraocular Movements: Extraocular movements intact.     Conjunctiva/sclera: Conjunctivae normal.     Pupils: Pupils are equal, round, and reactive to light.  Cardiovascular:     Rate and Rhythm: Normal rate.  Pulmonary:     Effort: Pulmonary effort is normal. No respiratory distress.  Abdominal:     General: Abdomen is flat.     Palpations: Abdomen is soft.  Musculoskeletal:        General: Normal range of motion.     Cervical back: Normal range of motion and neck supple. No tenderness.     Comments: Mild tenderness to palpation of the right shoulder.  No obvious deformity.  ROM intact.  Radial pulse intact and 2+.  No tenderness to palpation of cervical, thoracic, or lumbar spine.  Patient is ambulatory with steady gait.  Skin:    General: Skin is warm and dry.  Neurological:     General: No focal deficit present.     Mental Status: He is alert and oriented to person, place, and time.     Motor: No weakness.  Gait: Gait normal.  Psychiatric:        Mood and Affect: Mood normal.        Behavior: Behavior normal.     ED Results / Procedures / Treatments   Labs (all labs ordered are listed, but only abnormal results are displayed) Labs Reviewed - No data to display  EKG None  Radiology CT Head Wo Contrast  Result Date: 11/04/2022 CLINICAL DATA:  Trauma. EXAM: CT HEAD WITHOUT CONTRAST TECHNIQUE: Contiguous axial images were obtained from the base of the skull through the vertex without intravenous contrast. RADIATION DOSE REDUCTION: This exam was performed according to the departmental dose-optimization program which includes automated exposure control, adjustment of the mA and/or kV according to patient size and/or use of iterative reconstruction technique. COMPARISON:   CT head 03/06/2016 FINDINGS: Brain: No evidence of acute infarction, hemorrhage, hydrocephalus, extra-axial collection or mass lesion/mass effect. Vascular: No hyperdense vessel or unexpected calcification. Skull: Normal. Negative for fracture or focal lesion. Sinuses/Orbits: No acute finding. Other: None. IMPRESSION: No acute intracranial abnormality. Electronically Signed   By: Darliss Cheney M.D.   On: 11/04/2022 21:55   DG Shoulder Right  Result Date: 11/04/2022 CLINICAL DATA:  Fall.  Right shoulder pain. EXAM: RIGHT SHOULDER - 2+ VIEW COMPARISON:  None Available. FINDINGS: No acute fracture or dislocation. The bones are well mineralized. No arthritic changes. The soft tissues are unremarkable. IMPRESSION: Negative. Electronically Signed   By: Elgie Collard M.D.   On: 11/04/2022 21:39    Procedures Procedures    Medications Ordered in ED Medications - No data to display  ED Course/ Medical Decision Making/ A&P                           Medical Decision Making Amount and/or Complexity of Data Reviewed Radiology: ordered.   Patient presents today with complaints of mechanical fall today.  He is afebrile, nontoxic-appearing, and in no acute distress with reassuring vital signs.  Patient endorses a headache and right shoulder pain from the fall.  Will obtain CT imaging of the head and x-ray of the shoulder for further evaluation.  CT imaging resulted and reveals no acute intracranial abnormality.  X-ray of the right shoulder also resulted and reveals no acute findings.  I have personally reviewed and interpreted this imaging and agree with radiology interpretation.  After reassessment, patient states that he is feeling much better.  Will send with muscle relaxer and anti-inflammatory medication for residual symptoms.  Patient aware not to drive or operate heavy machinery while taking muscle relaxers.  No further emergent concerns, patient is stable for discharge.  Patient educated on red  flag symptoms that would prompt immediate return.  Patient understanding and amenable with plan, discharged in stable condition.   Final Clinical Impression(s) / ED Diagnoses Final diagnoses:  Fall, initial encounter    Rx / DC Orders ED Discharge Orders          Ordered    methocarbamol (ROBAXIN) 500 MG tablet  2 times daily        11/04/22 2252    meloxicam (MOBIC) 7.5 MG tablet  Daily        11/04/22 2252          An After Visit Summary was printed and given to the patient.     Vear Clock 11/04/22 2253    Terald Sleeper, MD 11/04/22 [phone]

## 2022-11-04 NOTE — ED Provider Triage Note (Signed)
Emergency Medicine Provider Triage Evaluation Note  Clayton Walker , a 26 y.o. male  was evaluated in triage.  Pt complains of fall. States that at work earlier today he slipped on the floor and fell and struck the right side of his head on the ground. States that he briefly lost consciousness. Is endorsing headache and right shoulder pain. Denies vision changes, nausea, or vomiting.  Review of Systems  Positive:  Negative:   Physical Exam  BP 116/68 (BP Location: Left Arm)   Pulse 97   Temp 98.3 F (36.8 C) (Oral)   Resp 20   SpO2 100%  Gen:   Awake, no distress  Resp:  Normal effort  MSK:   Moves extremities without difficulty  Other:    Medical Decision Making  Medically screening exam initiated at 9:27 PM.  Appropriate orders placed.  Clayton Walker was informed that the remainder of the evaluation will be completed by another provider, this initial triage assessment does not replace that evaluation, and the importance of remaining in the ED until their evaluation is complete.     Vear Clock 11/04/22 2128

## 2022-11-04 NOTE — Discharge Instructions (Signed)
As we discussed, your workup in the ER today was reassuring for acute findings.  X-ray imaging of your shoulder and CT imaging of your head did not reveal any emergent concerns.  I have given you a prescription for an anti-inflammatory medication for you to take as prescribed as needed for management of your pain.  Have also given you a prescription for a muscle relaxer Robaxin which she can take for any additional muscle soreness.  Please not drive or operate heavy machinery while taking this medication as it can be sedating.  Follow-up with your primary care doctor for any additional health needs  Return if development of any new or worsening symptoms.

## 2023-01-03 HISTORY — PX: FEMUR FRACTURE SURGERY: SHX633

## 2023-01-12 ENCOUNTER — Emergency Department (HOSPITAL_COMMUNITY): Payer: Medicaid Other

## 2023-01-12 ENCOUNTER — Emergency Department (HOSPITAL_COMMUNITY)
Admission: EM | Admit: 2023-01-12 | Discharge: 2023-01-13 | Disposition: A | Discharge status: Other Acute Inpatient Hospital | Payer: Medicaid Other | Attending: Emergency Medicine | Admitting: Emergency Medicine

## 2023-01-12 DIAGNOSIS — S73005A Unspecified dislocation of left hip, initial encounter: Secondary | ICD-10-CM | POA: Diagnosis not present

## 2023-01-12 DIAGNOSIS — R7989 Other specified abnormal findings of blood chemistry: Secondary | ICD-10-CM

## 2023-01-12 DIAGNOSIS — S3210XA Unspecified fracture of sacrum, initial encounter for closed fracture: Secondary | ICD-10-CM | POA: Insufficient documentation

## 2023-01-12 DIAGNOSIS — S72002A Fracture of unspecified part of neck of left femur, initial encounter for closed fracture: Secondary | ICD-10-CM

## 2023-01-12 DIAGNOSIS — S329XXA Fracture of unspecified parts of lumbosacral spine and pelvis, initial encounter for closed fracture: Secondary | ICD-10-CM

## 2023-01-12 DIAGNOSIS — Z23 Encounter for immunization: Secondary | ICD-10-CM | POA: Diagnosis not present

## 2023-01-12 DIAGNOSIS — S22088A Other fracture of T11-T12 vertebra, initial encounter for closed fracture: Secondary | ICD-10-CM | POA: Diagnosis not present

## 2023-01-12 DIAGNOSIS — E876 Hypokalemia: Secondary | ICD-10-CM | POA: Insufficient documentation

## 2023-01-12 DIAGNOSIS — R7309 Other abnormal glucose: Secondary | ICD-10-CM | POA: Diagnosis not present

## 2023-01-12 DIAGNOSIS — R7401 Elevation of levels of liver transaminase levels: Secondary | ICD-10-CM | POA: Insufficient documentation

## 2023-01-12 DIAGNOSIS — S32501A Unspecified fracture of right pubis, initial encounter for closed fracture: Secondary | ICD-10-CM | POA: Diagnosis not present

## 2023-01-12 DIAGNOSIS — S22050A Wedge compression fracture of T5-T6 vertebra, initial encounter for closed fracture: Secondary | ICD-10-CM | POA: Diagnosis not present

## 2023-01-12 DIAGNOSIS — S79912A Unspecified injury of left hip, initial encounter: Secondary | ICD-10-CM | POA: Diagnosis present

## 2023-01-12 DIAGNOSIS — S22089A Unspecified fracture of T11-T12 vertebra, initial encounter for closed fracture: Secondary | ICD-10-CM

## 2023-01-12 LAB — SAMPLE TO BLOOD BANK

## 2023-01-12 MED ORDER — TETANUS-DIPHTH-ACELL PERTUSSIS 5-2.5-18.5 LF-MCG/0.5 IM SUSY: 0.5000 mL | PREFILLED_SYRINGE | Freq: Once | INTRAMUSCULAR | Status: AC

## 2023-01-12 MED ORDER — TETANUS-DIPHTH-ACELL PERTUSSIS 5-2.5-18.5 LF-MCG/0.5 IM SUSY
PREFILLED_SYRINGE | INTRAMUSCULAR | Status: AC
  Administered 2023-01-13: 0.5 mL via INTRAMUSCULAR
  Filled 2023-01-12: qty 0.5

## 2023-01-12 MED ORDER — MORPHINE SULFATE (PF) 4 MG/ML IV SOLN
4.0000 mg | INTRAVENOUS | Status: AC | PRN
  Administered 2023-01-13 (×2): 4 mg via INTRAVENOUS
  Filled 2023-01-12 (×2): qty 1

## 2023-01-12 MED ORDER — MORPHINE SULFATE (PF) 4 MG/ML IV SOLN
4.0000 mg | Freq: Once | INTRAVENOUS | Status: AC
  Administered 2023-01-12: 4 mg via INTRAVENOUS
  Filled 2023-01-12: qty 1

## 2023-01-12 NOTE — Progress Notes (Signed)
Orthopedic Tech Progress Note Patient Details:  Clayton Walker February 09, 1996 [id number]  Patient ID: Clayton Walker, male   DOB: 1996-09-07, 27 y.o.   MRN: [id number] I attended trauma page. Karolee Stamps 01/12/2023, 11:41 PM
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
## 2023-01-13 ENCOUNTER — Other Ambulatory Visit: Payer: Self-pay

## 2023-01-13 DIAGNOSIS — S32501A Unspecified fracture of right pubis, initial encounter for closed fracture: Secondary | ICD-10-CM | POA: Diagnosis not present

## 2023-01-13 DIAGNOSIS — R7309 Other abnormal glucose: Secondary | ICD-10-CM | POA: Diagnosis not present

## 2023-01-13 DIAGNOSIS — Z23 Encounter for immunization: Secondary | ICD-10-CM | POA: Diagnosis not present

## 2023-01-13 DIAGNOSIS — S79912A Unspecified injury of left hip, initial encounter: Secondary | ICD-10-CM | POA: Diagnosis present

## 2023-01-13 DIAGNOSIS — S3210XA Unspecified fracture of sacrum, initial encounter for closed fracture: Secondary | ICD-10-CM | POA: Diagnosis not present

## 2023-01-13 DIAGNOSIS — S22050A Wedge compression fracture of T5-T6 vertebra, initial encounter for closed fracture: Secondary | ICD-10-CM | POA: Diagnosis not present

## 2023-01-13 DIAGNOSIS — S22088A Other fracture of T11-T12 vertebra, initial encounter for closed fracture: Secondary | ICD-10-CM | POA: Diagnosis not present

## 2023-01-13 DIAGNOSIS — R7401 Elevation of levels of liver transaminase levels: Secondary | ICD-10-CM | POA: Diagnosis not present

## 2023-01-13 DIAGNOSIS — S73005A Unspecified dislocation of left hip, initial encounter: Secondary | ICD-10-CM | POA: Diagnosis not present

## 2023-01-13 DIAGNOSIS — E876 Hypokalemia: Secondary | ICD-10-CM | POA: Diagnosis not present

## 2023-01-13 LAB — CBC
HCT: 39.9 % (ref 39.0–52.0)
Hemoglobin: 14.1 g/dL (ref 13.0–17.0)
MCH: 32.5 pg (ref 26.0–34.0)
MCHC: 35.3 g/dL (ref 30.0–36.0)
MCV: 91.9 fL (ref 80.0–100.0)
Platelets: 214 10*3/uL (ref 150–400)
RBC: 4.34 MIL/uL (ref 4.22–5.81)
RDW: 12.4 % (ref 11.5–15.5)
WBC: 12.5 10*3/uL — ABNORMAL HIGH (ref 4.0–10.5)
nRBC: 0.2 % (ref 0.0–0.2)

## 2023-01-13 LAB — PROTIME-INR
INR: 1.1 (ref 0.8–1.2)
Prothrombin Time: 14.2 seconds (ref 11.4–15.2)

## 2023-01-13 LAB — COMPREHENSIVE METABOLIC PANEL
ALT: 66 U/L — ABNORMAL HIGH (ref 0–44)
AST: 110 U/L — ABNORMAL HIGH (ref 15–41)
Albumin: 3.7 g/dL (ref 3.5–5.0)
Alkaline Phosphatase: 64 U/L (ref 38–126)
Anion gap: 8 (ref 5–15)
BUN: 17 mg/dL (ref 6–20)
CO2: 26 mmol/L (ref 22–32)
Calcium: 8.3 mg/dL — ABNORMAL LOW (ref 8.9–10.3)
Chloride: 103 mmol/L (ref 98–111)
Creatinine, Ser: 1.12 mg/dL (ref 0.61–1.24)
GFR, Estimated: >60 mL/min (ref >60)
Glucose, Bld: 116 mg/dL — ABNORMAL HIGH (ref 70–99)
Potassium: 3.1 mmol/L — ABNORMAL LOW (ref 3.5–5.1)
Sodium: 137 mmol/L (ref 135–145)
Total Bilirubin: 0.9 mg/dL (ref 0.3–1.2)
Total Protein: 6 g/dL — ABNORMAL LOW (ref 6.5–8.1)

## 2023-01-13 LAB — I-STAT CHEM 8, ED
BUN: 20 mg/dL (ref 6–20)
Calcium, Ion: 1.09 mmol/L — ABNORMAL LOW (ref 1.15–1.40)
Chloride: 102 mmol/L (ref 98–111)
Creatinine, Ser: 1 mg/dL (ref 0.61–1.24)
Glucose, Bld: 112 mg/dL — ABNORMAL HIGH (ref 70–99)
HCT: 40 % (ref 39.0–52.0)
Hemoglobin: 13.6 g/dL (ref 13.0–17.0)
Potassium: 3.1 mmol/L — ABNORMAL LOW (ref 3.5–5.1)
Sodium: 140 mmol/L (ref 135–145)
TCO2: 26 mmol/L (ref 22–32)

## 2023-01-13 LAB — LACTIC ACID, PLASMA: Lactic Acid, Venous: 2.1 mmol/L (ref 0.5–1.9)

## 2023-01-13 LAB — ETHANOL: Alcohol, Ethyl (B): <10 mg/dL (ref <10)

## 2023-01-13 MED ORDER — SODIUM CHLORIDE 0.9 % IV BOLUS
1000.0000 mL | Freq: Once | INTRAVENOUS | Status: AC
  Administered 2023-01-13: 1000 mL via INTRAVENOUS

## 2023-01-13 MED ORDER — HYDROMORPHONE HCL 1 MG/ML IJ SOLN
1.0000 mg | Freq: Once | INTRAMUSCULAR | Status: AC
  Administered 2023-01-13: 1 mg via INTRAVENOUS

## 2023-01-13 MED ORDER — HYDROMORPHONE HCL 1 MG/ML IJ SOLN: 1.0000 mg | Freq: Once | INTRAMUSCULAR | Status: DC

## 2023-01-13 MED ORDER — HYDROMORPHONE HCL 1 MG/ML IJ SOLN
1.0000 mg | Freq: Once | INTRAMUSCULAR | Status: AC
  Administered 2023-01-13: 1 mg via INTRAVENOUS
  Filled 2023-01-13: qty 1

## 2023-01-13 MED ORDER — IOHEXOL 350 MG/ML SOLN
75.0000 mL | Freq: Once | INTRAVENOUS | Status: AC | PRN
  Administered 2023-01-13: 75 mL via INTRAVENOUS

## 2023-01-13 MED ORDER — HYDROMORPHONE HCL 1 MG/ML IJ SOLN
1.0000 mg | Freq: Once | INTRAMUSCULAR | Status: DC
  Filled 2023-01-13: qty 1

## 2023-01-13 NOTE — ED Notes (Signed)
Contacted xray to push images through for [facility] to access results

## 2023-01-22 MED FILL — Fentanyl Citrate Preservative Free (PF) Inj 100 MCG/2ML: INTRAMUSCULAR | Qty: 2 | Status: AC

## 2023-01-29 NOTE — Discharge Summary (Signed)
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 Acute, comminuted fracture of the left femoral head and neck.  2. Acute, comminuted fractures through the right superior and inferior pubic rami and pubic root. Fracture plane extends to the right acetabulum. 3. Known comminuted sacral fracture is not well demonstrated on this exam.   Falls with Injury:  None on IPR.   **Drug regimen reviewed on discharge: No issues found.  This is a shared visit with Dr. Ryan Moats.  Time spent on discharge: 20 min    Electronically signed by: Abran Verneita Berger, PA-C 01/30/23 1349    Electronically signed by: Moats Ryan Horner, MD 02/04/23 1304

## 2023-05-04 HISTORY — PX: HIP ARTHROPLASTY: SHX981

## 2023-05-07 NOTE — Discharge Summary (Signed)
 ------------------------------------------------------------------------------- Attestation signed by Geofm Alan Reach, MD at 05/07/2023  4:46 PM I have personally seen and examined the patient and agree with the findings and plan as documented by Laymon Lack, NP.  Geofm Reach, MD  -------------------------------------------------------------------------------  Orthopedic Surgery Discharge Summary  Patient ID: Clayton Walker [id number] 27 y.o. 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     Procedures performed during hospitalization: @ENCPROC @  Surgical operations performed during hospitalization: Procedure(s) (LRB): ARTHROPLASTY HIP TOTAL ANTERIOR (Left)   Indications for Operative Procedures:  Hospital Course:   The patient was taken to the operating room on 05/07/2023 and underwent the above stated surgical procedure without complications.  For details of the operative procedure, please refer to the operative note.  He was then transferred to the post-op Orthopaedic floor for recovery, physical therapy and discharge planning. The patient was given prophylactic dose ECASA for the prevention of DVTs, along with early ambulation and sequential compression devices.   The patient had adequate pain control with po pain medications and IV pain medications for breakthrough pain. At the time of discharge, the patient's pain was well controlled with po pain medications.  Physical and Occupational therapy were initiated and worked with the patient towards discharge goals with clearance from PT for discharge home by the time of discharge.   The patient will be  WBAT to the left side.The patient has a Aquacel dressing in place to the surgical incision. This can be removed 05/17/23. At the time of discharge, the patient is afebrile, vital signs are stable and the patient is in no acute distress. Compartments are soft. Peripheral pulses are 2+ bilaterally. The patient is neurovascularly intact with an exam as per the last progress note. The patient is medically stable and safe for discharge home to continue their rehabilitation.   Consults:  @ENCORDCONSULTS @  Disposition: Home or Self Care  Patient Instructions:     Medication List     START taking these medications    adhesive tape 2 X 10 -yard Tape Apply to dressing twice daily   aspirin  81 mg EC tablet Take 1 tablet by mouth two times daily for 6 weeks (Take 81mg  ECASA twice daily x 6 weeks)   celecoxib 200 mg capsule Commonly known as: CeleBREX Take 1 capsule (200 mg total) by mouth daily. Start taking on: May 08, 2023   cephALEXin  500 mg capsule Commonly known as: KEFLEX  Take 1 capsule (500 mg total) by mouth once for 1 dose.   non-adherent bandage 5 X 9  Bndg Apply topically 2 (two) times a day. (Apply 30 each topically 2 (two) times a day.)   oxyCODONE -acetaminophen  5-325 mg per tablet Commonly known as: PERCOCET Take 1-2 tablets by mouth every 4 (four) hours as needed for moderate pain or severe pain. (Take 1-2 tablets by mouth every 4 (four) hours as needed for moderate pain (4-6) or severe pain (7-10).)   povidone-iodine 10 % Soln Commonly known as: BETADINE Clean surgical site twice daily   sennosides-docusate sodium 8.6-50 mg per tablet Commonly known as: PERICOLACE Take 2 tablets by mouth 2 (two) times a day.         Where to Get Your Medications  These medications were sent to [facility]  [address] [place], [place] [address]    Hours: Mon-Fri: 8:30am - 5pm; Sat-Sun: Closed; Holidays: Closed Phone: [phone]  adhesive tape  2 X 10 -yard Tape aspirin  81 mg EC tablet celecoxib 200 mg capsule cephALEXin  500 mg capsule non-adherent bandage 5 X 9  Bndg oxyCODONE -acetaminophen  5-325 mg per tablet povidone-iodine 10 % Soln sennosides-docusate sodium 8.6-50 mg per tablet      Activity: activity as tolerated Diet: regular diet Wound Care: keep wound clean and dry Weight Bearing Status: WBAT DVT PPX: ECASA 81mg  twice daily x 6 weeks  Please contact your doctor or seek medical assistance if you experience any of the following:  1. Fever greater than 101.5 F. 2. Decrease in urinary output. 3. Increased warmth, swelling, redness, or pain at your incision/wound site. 4. Increased drainage or bad odor at your incision/wound site.  5. Nausea/vomiting that does not stop.  6. Numbness, tingling, or discoloration of extremity.  7. Unable to drink fluids.  8. Uncontrollable pain.  9. Any other concerning symptoms.   Please, call 911 or go to your nearest emergency room if you experience any of the following:  1. Change in speech, vision, or ability to walk 2. Chest pain.  3. Difficulty breathing or shortness of breath.  Follow-up:  Future Appointments  Date Time Provider Department Center  05/09/2023  3:45 PM Maurine Avelina Hai, PT [facility] PT [place] None  05/14/2023  2:15 PM Delon Laming [facility] [place] None  05/17/2023 11:15 AM Delon Laming [facility] PT [place] None  05/21/2023 12:45 PM Delon Laming [facility] PT [place] None  05/21/2023  4:00 PM [facility] JOINT NURSE [facility] JNT [facility] [place]  05/24/2023  9:45 AM Delon Laming [facility] PT [place] None  05/28/2023  1:30 PM Delon Laming [facility] [place] None  05/31/2023 10:30 AM Delon Laming [facility] PT [place] None  06/26/2023  2:20 PM Molly Alan Reach, MD [facility] MSK JNT [facility] [place]  08/01/2023  2:15 PM [facility] XR1 [facility] MPM XR [facility] [place]  08/01/2023  2:30 PM Silvano Coit Bent, MD [facility] ORT TRA [facility] [place]  09/09/2023  3:20 PM Molly Alan Reach, MD [facility]K JNT [facility] [place]    Laymon Orlando Lack, FNP

## 2024-04-16 ENCOUNTER — Other Ambulatory Visit: Payer: Self-pay

## 2024-04-16 ENCOUNTER — Emergency Department (HOSPITAL_COMMUNITY)
Admission: EM | Admit: 2024-04-16 | Discharge: 2024-04-16 | Disposition: A | Discharge status: Home / Self Care | Attending: Emergency Medicine | Admitting: Emergency Medicine

## 2024-04-16 DIAGNOSIS — J45909 Unspecified asthma, uncomplicated: Secondary | ICD-10-CM | POA: Diagnosis not present

## 2024-04-16 DIAGNOSIS — D72829 Elevated white blood cell count, unspecified: Secondary | ICD-10-CM | POA: Diagnosis not present

## 2024-04-16 DIAGNOSIS — L03116 Cellulitis of left lower limb: Secondary | ICD-10-CM | POA: Insufficient documentation

## 2024-04-16 DIAGNOSIS — L03119 Cellulitis of unspecified part of limb: Secondary | ICD-10-CM

## 2024-04-16 DIAGNOSIS — F17210 Nicotine dependence, cigarettes, uncomplicated: Secondary | ICD-10-CM | POA: Insufficient documentation

## 2024-04-16 DIAGNOSIS — R2242 Localized swelling, mass and lump, left lower limb: Secondary | ICD-10-CM | POA: Diagnosis present

## 2024-04-16 DIAGNOSIS — D649 Anemia, unspecified: Secondary | ICD-10-CM | POA: Diagnosis not present

## 2024-04-16 LAB — CBC WITH DIFFERENTIAL/PLATELET
Abs Immature Granulocytes: 0.04 10*3/uL (ref 0.00–0.07)
Basophils Absolute: 0.1 10*3/uL (ref 0.0–0.1)
Basophils Relative: 1 %
Eosinophils Absolute: 0.7 10*3/uL — ABNORMAL HIGH (ref 0.0–0.5)
Eosinophils Relative: 5 %
HCT: 33.4 % — ABNORMAL LOW (ref 39.0–52.0)
Hemoglobin: 10.8 g/dL — ABNORMAL LOW (ref 13.0–17.0)
Immature Granulocytes: 0 %
Lymphocytes Relative: 16 %
Lymphs Abs: 2.3 10*3/uL (ref 0.7–4.0)
MCH: 26.8 pg (ref 26.0–34.0)
MCHC: 32.3 g/dL (ref 30.0–36.0)
MCV: 82.9 fL (ref 80.0–100.0)
Monocytes Absolute: 1.5 10*3/uL — ABNORMAL HIGH (ref 0.1–1.0)
Monocytes Relative: 11 %
Neutro Abs: 9.3 10*3/uL — ABNORMAL HIGH (ref 1.7–7.7)
Neutrophils Relative %: 67 %
Platelets: 281 10*3/uL (ref 150–400)
RBC: 4.03 MIL/uL — ABNORMAL LOW (ref 4.22–5.81)
RDW: 16.5 % — ABNORMAL HIGH (ref 11.5–15.5)
WBC: 13.9 10*3/uL — ABNORMAL HIGH (ref 4.0–10.5)
nRBC: 0 % (ref 0.0–0.2)

## 2024-04-16 LAB — BASIC METABOLIC PANEL WITH GFR
Anion gap: 8 (ref 5–15)
BUN: 14 mg/dL (ref 6–20)
CO2: 28 mmol/L (ref 22–32)
Calcium: 8.3 mg/dL — ABNORMAL LOW (ref 8.9–10.3)
Chloride: 100 mmol/L (ref 98–111)
Creatinine, Ser: 0.95 mg/dL (ref 0.61–1.24)
GFR, Estimated: >60 mL/min (ref >60)
Glucose, Bld: 87 mg/dL (ref 70–99)
Potassium: 3.6 mmol/L (ref 3.5–5.1)
Sodium: 136 mmol/L (ref 135–145)

## 2024-04-16 MED ORDER — CEPHALEXIN 500 MG PO CAPS: 500.0000 mg | ORAL_CAPSULE | Freq: Four times a day (QID) | ORAL | 0 refills | Status: DC

## 2024-04-16 MED ORDER — CEPHALEXIN 500 MG PO CAPS
500.0000 mg | ORAL_CAPSULE | Freq: Once | ORAL | Status: AC
  Administered 2024-04-16: 500 mg via ORAL
  Filled 2024-04-16: qty 1

## 2024-04-16 MED ORDER — KETOROLAC TROMETHAMINE 15 MG/ML IJ SOLN
15.0000 mg | Freq: Once | INTRAMUSCULAR | Status: AC
  Administered 2024-04-16: 15 mg via INTRAMUSCULAR
  Filled 2024-04-16: qty 1
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
## 2024-04-16 NOTE — Discharge Instructions (Addendum)
 We evaluated you for your rash and foot swelling.  Your examination shows signs of cellulitis, type of skin infection.  We have prescribed you antibiotics.  Please take these as prescribed.  Please take Tylenol  (acetaminophen ) and Motrin  (ibuprofen ) for your symptoms at home.  You can take 1000 mg of Tylenol  every 6 hours and 600 mg of Motrin  every 6 hours as needed for your symptoms.  You can take these medicines together as needed, either at the same time, or alternating every 3 hours.  You can also elevate your leg and apply ice to help with pain and swelling.  Please follow-up with your primary care doctor in around 1 week for recheck.  If you have any worsening symptoms, such as severe uncontrolled pain, uncontrolled vomiting, worsening swelling, develop wounds to your leg, or have any drainage of pus, please return to the emergency department for reassessment.

## 2024-04-16 NOTE — ED Triage Notes (Signed)
 Pt complaining of right foot swelling. Pt states that he laid down last night for a nap and when woke, he walked around and noticed swelling. Skin is warm to touch and resembles possible cellulitis.

## 2024-07-15 NOTE — Progress Notes (Signed)
 Subjective Patient ID: Clayton Walker is a 28 y.o. male.  Chief Complaint  Patient presents with  . Finger Injury    Took some skin off of his finger after jamming it on Friday, swolen red and painful.    The following information was reviewed by members of the visit team:  Tobacco  Allergies  Med Hx  Surg Hx  Fam Hx  Soc Hx     28 year old male who presents for evaluation of left fifth finger pain and swelling after injury 3 to 4 days ago.  Patient states he bumped his finger causing an abrasion.  Since that time finger has become quite swollen, red, painful.  States it is somewhat difficult to move the finger due to pain.  Denies numbness or tingling in the finger.  He has had no fever, chills, nausea, vomiting.  States the finger was throbbing and painful which made sleep difficult last night.    Review of Systems  Constitutional:  Negative for chills and fever.  Respiratory:  Negative for shortness of breath.   Cardiovascular:  Negative for chest pain.  Gastrointestinal:  Negative for nausea and vomiting.  Musculoskeletal:  Positive for arthralgias and joint swelling.  Skin:  Positive for wound. Negative for rash.  Neurological:  Negative for dizziness and headaches.  Hematological:  Negative for adenopathy. Does not bruise/bleed easily.    Objective Physical Exam Vitals reviewed.  Constitutional:      General: He is not in acute distress.    Appearance: Normal appearance. He is not ill-appearing, toxic-appearing or diaphoretic.   Cardiovascular:     Rate and Rhythm: Normal rate and regular rhythm.     Pulses: Normal pulses.  Pulmonary:     Effort: Pulmonary effort is normal.   Musculoskeletal:     Comments: There is moderate swelling and erythema to the left fifth finger.  Swelling appears circumferential.  Range of motion in the finger is decreased secondary to swelling.  Sensation and capillary refill intact distally.  There is apparent collection of  purulent material medially consistent with possible abscess.  There is no streaking, redness or signs of infection in the hand   Skin:    General: Skin is warm and dry.     Capillary Refill: Capillary refill takes less than 2 seconds.     Findings: No rash.   Neurological:     Mental Status: He is alert and oriented to person, place, and time.   Psychiatric:        Mood and Affect: Mood normal.     Assessment/Plan 28 year old male with redness, swelling, pain to the left fifth finger after injury 3 to 4 days ago Symptoms and exam findings are consistent with cellulitis to the left fifth finger.  Some signs and findings concerning for possible abscess.  There is also some concern for infectious tenosynovitis given degree of swelling, pain and difficulty in moving the finger.  Discussed treatment options with patient including IM antibiotics, possible I&D and antibiotics at home.  Patient elects to have antibiotics at home.  Recommend close follow-up and prompt evaluation in the emergency department should swelling, pain worsen at all or if develops numbness, tingling or inability to move the finger.  Will send prescription for Bactrim to the pharmacy. Patient expresses understanding of and agreement with plan of care.  States he may go to the emergency department tonight for evaluation. Urgent Care Disposition:  Home Care    Electronically signed: Franky Floria Finder, PA-C  07/15/2024  9:37 PM

## 2024-07-17 ENCOUNTER — Inpatient Hospital Stay (HOSPITAL_COMMUNITY): Admitting: Certified Registered Nurse Anesthetist

## 2024-07-17 ENCOUNTER — Encounter (HOSPITAL_COMMUNITY): Payer: Self-pay

## 2024-07-17 ENCOUNTER — Emergency Department (HOSPITAL_COMMUNITY)

## 2024-07-17 ENCOUNTER — Inpatient Hospital Stay (HOSPITAL_COMMUNITY)
Admission: EM | Admit: 2024-07-17 | Discharge: 2024-07-20 | DRG: 513 | Disposition: A | Discharge status: Home / Self Care | Attending: Family Medicine | Admitting: Family Medicine

## 2024-07-17 ENCOUNTER — Encounter (HOSPITAL_COMMUNITY)
Admission: EM | Disposition: A | Discharge status: Home / Self Care | Payer: Self-pay | Source: Home / Self Care | Attending: Family Medicine

## 2024-07-17 ENCOUNTER — Other Ambulatory Visit: Payer: Self-pay

## 2024-07-17 DIAGNOSIS — Z8249 Family history of ischemic heart disease and other diseases of the circulatory system: Secondary | ICD-10-CM | POA: Diagnosis not present

## 2024-07-17 DIAGNOSIS — F17210 Nicotine dependence, cigarettes, uncomplicated: Secondary | ICD-10-CM | POA: Diagnosis present

## 2024-07-17 DIAGNOSIS — S63611A Unspecified sprain of left index finger, initial encounter: Secondary | ICD-10-CM | POA: Diagnosis present

## 2024-07-17 DIAGNOSIS — M659 Unspecified synovitis and tenosynovitis, unspecified site: Secondary | ICD-10-CM | POA: Diagnosis present

## 2024-07-17 DIAGNOSIS — I10 Essential (primary) hypertension: Secondary | ICD-10-CM | POA: Diagnosis not present

## 2024-07-17 DIAGNOSIS — Z880 Allergy status to penicillin: Secondary | ICD-10-CM | POA: Diagnosis not present

## 2024-07-17 DIAGNOSIS — M79646 Pain in unspecified finger(s): Secondary | ICD-10-CM | POA: Diagnosis not present

## 2024-07-17 DIAGNOSIS — L02512 Cutaneous abscess of left hand: Secondary | ICD-10-CM | POA: Diagnosis present

## 2024-07-17 DIAGNOSIS — M65949 Unspecified synovitis and tenosynovitis, unspecified hand: Principal | ICD-10-CM

## 2024-07-17 DIAGNOSIS — S63617A Unspecified sprain of left little finger, initial encounter: Secondary | ICD-10-CM | POA: Diagnosis present

## 2024-07-17 DIAGNOSIS — W208XXA Other cause of strike by thrown, projected or falling object, initial encounter: Secondary | ICD-10-CM | POA: Diagnosis present

## 2024-07-17 DIAGNOSIS — Z7401 Bed confinement status: Secondary | ICD-10-CM | POA: Diagnosis not present

## 2024-07-17 DIAGNOSIS — M65942 Unspecified synovitis and tenosynovitis, left hand: Secondary | ICD-10-CM | POA: Diagnosis present

## 2024-07-17 DIAGNOSIS — Z23 Encounter for immunization: Secondary | ICD-10-CM

## 2024-07-17 DIAGNOSIS — M60042 Infective myositis, left hand: Secondary | ICD-10-CM | POA: Diagnosis not present

## 2024-07-17 DIAGNOSIS — R609 Edema, unspecified: Secondary | ICD-10-CM | POA: Diagnosis not present

## 2024-07-17 DIAGNOSIS — Z96642 Presence of left artificial hip joint: Secondary | ICD-10-CM | POA: Diagnosis present

## 2024-07-17 HISTORY — PX: INCISION AND DRAINAGE OF WOUND: SHX1803

## 2024-07-17 LAB — CBC WITH DIFFERENTIAL/PLATELET
Abs Immature Granulocytes: 0.04 K/uL (ref 0.00–0.07)
Basophils Absolute: 0.1 K/uL (ref 0.0–0.1)
Basophils Relative: 1 %
Eosinophils Absolute: 0.4 K/uL (ref 0.0–0.5)
Eosinophils Relative: 3 %
HCT: 41.2 % (ref 39.0–52.0)
Hemoglobin: 13 g/dL (ref 13.0–17.0)
Immature Granulocytes: 0 %
Lymphocytes Relative: 16 %
Lymphs Abs: 2.3 K/uL (ref 0.7–4.0)
MCH: 26.1 pg (ref 26.0–34.0)
MCHC: 31.6 g/dL (ref 30.0–36.0)
MCV: 82.6 fL (ref 80.0–100.0)
Monocytes Absolute: 1.4 K/uL — ABNORMAL HIGH (ref 0.1–1.0)
Monocytes Relative: 10 %
Neutro Abs: 10.5 K/uL — ABNORMAL HIGH (ref 1.7–7.7)
Neutrophils Relative %: 70 %
Platelets: 349 K/uL (ref 150–400)
RBC: 4.99 MIL/uL (ref 4.22–5.81)
RDW: 16.3 % — ABNORMAL HIGH (ref 11.5–15.5)
WBC: 14.7 K/uL — ABNORMAL HIGH (ref 4.0–10.5)
nRBC: 0 % (ref 0.0–0.2)

## 2024-07-17 LAB — BASIC METABOLIC PANEL WITH GFR
Anion gap: 10 (ref 5–15)
BUN: 11 mg/dL (ref 6–20)
CO2: 24 mmol/L (ref 22–32)
Calcium: 9.1 mg/dL (ref 8.9–10.3)
Chloride: 102 mmol/L (ref 98–111)
Creatinine, Ser: 0.83 mg/dL (ref 0.61–1.24)
GFR, Estimated: >60 mL/min (ref >60)
Glucose, Bld: 94 mg/dL (ref 70–99)
Potassium: 4 mmol/L (ref 3.5–5.1)
Sodium: 136 mmol/L (ref 135–145)

## 2024-07-17 SURGERY — IRRIGATION AND DEBRIDEMENT WOUND
Anesthesia: General | Site: Hand | Laterality: Left

## 2024-07-17 SURGERY — REMOVAL, CYST, HAND
Anesthesia: Choice | Laterality: Left

## 2024-07-17 MED ORDER — HYDROMORPHONE HCL 1 MG/ML IJ SOLN
0.5000 mg | INTRAMUSCULAR | Status: DC | PRN
  Administered 2024-07-17 – 2024-07-19 (×10): 1 mg via INTRAVENOUS
  Filled 2024-07-17 (×11): qty 1

## 2024-07-17 MED ORDER — LIDOCAINE 2% (20 MG/ML) 5 ML SYRINGE
INTRAMUSCULAR | Status: DC | PRN
  Administered 2024-07-17: 25 mg via INTRAVENOUS

## 2024-07-17 MED ORDER — PROPOFOL 10 MG/ML IV BOLUS
INTRAVENOUS | Status: AC
  Filled 2024-07-17: qty 20

## 2024-07-17 MED ORDER — ACETAMINOPHEN 325 MG PO TABS
650.0000 mg | ORAL_TABLET | Freq: Four times a day (QID) | ORAL | Status: DC | PRN
Start: 2024-07-17 — End: 2024-07-20

## 2024-07-17 MED ORDER — OXYCODONE HCL 5 MG/5ML PO SOLN: 5.0000 mg | Freq: Once | ORAL | Status: DC | PRN

## 2024-07-17 MED ORDER — CHLORHEXIDINE GLUCONATE 0.12 % MT SOLN
15.0000 mL | Freq: Once | OROMUCOSAL | Status: AC
  Administered 2024-07-17: 15 mL via OROMUCOSAL
  Filled 2024-07-17: qty 15

## 2024-07-17 MED ORDER — ONDANSETRON HCL 4 MG/2ML IJ SOLN
4.0000 mg | Freq: Once | INTRAMUSCULAR | Status: AC
  Administered 2024-07-17: 4 mg via INTRAVENOUS
  Filled 2024-07-17: qty 2

## 2024-07-17 MED ORDER — FENTANYL CITRATE (PF) 250 MCG/5ML IJ SOLN
INTRAMUSCULAR | Status: DC | PRN
  Administered 2024-07-17 (×2): 50 ug via INTRAVENOUS
  Administered 2024-07-17: 100 ug via INTRAVENOUS
  Administered 2024-07-17: 50 ug via INTRAVENOUS

## 2024-07-17 MED ORDER — FENTANYL CITRATE (PF) 250 MCG/5ML IJ SOLN
INTRAMUSCULAR | Status: AC
  Filled 2024-07-17: qty 5

## 2024-07-17 MED ORDER — 0.9 % SODIUM CHLORIDE (POUR BTL) OPTIME
TOPICAL | Status: DC | PRN
  Administered 2024-07-17: 1000 mL

## 2024-07-17 MED ORDER — MORPHINE SULFATE (PF) 4 MG/ML IV SOLN
4.0000 mg | Freq: Once | INTRAVENOUS | Status: AC
  Administered 2024-07-17: 4 mg via INTRAVENOUS
  Filled 2024-07-17: qty 1

## 2024-07-17 MED ORDER — OXYCODONE HCL 5 MG PO TABS
5.0000 mg | ORAL_TABLET | ORAL | Status: DC | PRN
  Administered 2024-07-17 – 2024-07-18 (×3): 5 mg via ORAL
  Filled 2024-07-17 (×3): qty 1

## 2024-07-17 MED ORDER — ORAL CARE MOUTH RINSE: 15.0000 mL | Freq: Once | OROMUCOSAL | Status: AC

## 2024-07-17 MED ORDER — LACTATED RINGERS IV SOLN: INTRAVENOUS | Status: DC

## 2024-07-17 MED ORDER — ONDANSETRON HCL 4 MG/2ML IJ SOLN
INTRAMUSCULAR | Status: AC
Start: 2024-07-17 — End: 2024-07-17
  Filled 2024-07-17: qty 2

## 2024-07-17 MED ORDER — TETANUS-DIPHTH-ACELL PERTUSSIS 5-2.5-18.5 LF-MCG/0.5 IM SUSY
0.5000 mL | PREFILLED_SYRINGE | Freq: Once | INTRAMUSCULAR | Status: AC
  Administered 2024-07-17: 0.5 mL via INTRAMUSCULAR
  Filled 2024-07-17: qty 0.5

## 2024-07-17 MED ORDER — SODIUM CHLORIDE 0.9 % IV SOLN
Freq: Once | INTRAVENOUS | Status: AC
Start: 2024-07-17 — End: 2024-07-17

## 2024-07-17 MED ORDER — OXYCODONE HCL 5 MG PO TABS: 5.0000 mg | ORAL_TABLET | Freq: Once | ORAL | Status: DC | PRN

## 2024-07-17 MED ORDER — DEXAMETHASONE SODIUM PHOSPHATE 10 MG/ML IJ SOLN
INTRAMUSCULAR | Status: AC
Start: 2024-07-17 — End: 2024-07-17
  Filled 2024-07-17: qty 1

## 2024-07-17 MED ORDER — HYDROMORPHONE HCL 1 MG/ML IJ SOLN
0.5000 mg | Freq: Once | INTRAMUSCULAR | Status: AC
  Administered 2024-07-17: 0.5 mg via INTRAVENOUS
  Filled 2024-07-17: qty 1

## 2024-07-17 MED ORDER — ONDANSETRON HCL 4 MG PO TABS: 4.0000 mg | ORAL_TABLET | Freq: Four times a day (QID) | ORAL | Status: DC | PRN

## 2024-07-17 MED ORDER — ONDANSETRON HCL 4 MG/2ML IJ SOLN: 4.0000 mg | Freq: Four times a day (QID) | INTRAMUSCULAR | Status: DC | PRN

## 2024-07-17 MED ORDER — BUPIVACAINE HCL (PF) 0.25 % IJ SOLN
INTRAMUSCULAR | Status: DC | PRN
  Administered 2024-07-17: 9 mL

## 2024-07-17 MED ORDER — VANCOMYCIN HCL 1250 MG/250ML IV SOLN
1250.0000 mg | Freq: Two times a day (BID) | INTRAVENOUS | Status: DC
  Administered 2024-07-18 – 2024-07-20 (×5): 1250 mg via INTRAVENOUS
  Filled 2024-07-17 (×7): qty 250

## 2024-07-17 MED ORDER — MIDAZOLAM HCL 2 MG/2ML IJ SOLN
INTRAMUSCULAR | Status: AC
  Filled 2024-07-17: qty 2

## 2024-07-17 MED ORDER — ONDANSETRON HCL 4 MG/2ML IJ SOLN
INTRAMUSCULAR | Status: DC | PRN
  Administered 2024-07-17: 4 mg via INTRAVENOUS

## 2024-07-17 MED ORDER — MIDAZOLAM HCL 2 MG/2ML IJ SOLN
INTRAMUSCULAR | Status: DC | PRN
  Administered 2024-07-17: 2 mg via INTRAVENOUS

## 2024-07-17 MED ORDER — VANCOMYCIN HCL IN DEXTROSE 1-5 GM/200ML-% IV SOLN
1000.0000 mg | Freq: Once | INTRAVENOUS | Status: AC
  Administered 2024-07-17: 1000 mg via INTRAVENOUS
  Filled 2024-07-17: qty 200

## 2024-07-17 MED ORDER — DEXAMETHASONE SODIUM PHOSPHATE 10 MG/ML IJ SOLN
INTRAMUSCULAR | Status: DC | PRN
  Administered 2024-07-17: 10 mg via INTRAVENOUS

## 2024-07-17 MED ORDER — FENTANYL CITRATE (PF) 100 MCG/2ML IJ SOLN: 25.0000 ug | INTRAMUSCULAR | Status: DC | PRN

## 2024-07-17 MED ORDER — BUPIVACAINE HCL (PF) 0.25 % IJ SOLN
INTRAMUSCULAR | Status: AC
Start: 2024-07-17 — End: 2024-07-17
  Filled 2024-07-17: qty 30

## 2024-07-17 MED ORDER — PROPOFOL 10 MG/ML IV BOLUS
INTRAVENOUS | Status: DC | PRN
  Administered 2024-07-17: 150 mg via INTRAVENOUS

## 2024-07-17 MED ORDER — ACETAMINOPHEN 650 MG RE SUPP: 650.0000 mg | Freq: Four times a day (QID) | RECTAL | Status: DC | PRN

## 2024-07-17 MED ORDER — ALBUTEROL SULFATE (2.5 MG/3ML) 0.083% IN NEBU: 2.5000 mg | INHALATION_SOLUTION | RESPIRATORY_TRACT | Status: DC | PRN

## 2024-07-17 SURGICAL SUPPLY — 50 items
ADAPTER CATH SYR TO TUBING 38M (ADAPTER) ×2 IMPLANT
BAG COUNTER SPONGE SURGICOUNT (BAG) ×2 IMPLANT
BNDG COMPR ESMARK 4X3 LF (GAUZE/BANDAGES/DRESSINGS) IMPLANT
BNDG ELASTIC 3INX 5YD STR LF (GAUZE/BANDAGES/DRESSINGS) ×2 IMPLANT
BNDG ELASTIC 4X5.8 VLCR STR LF (GAUZE/BANDAGES/DRESSINGS) ×2 IMPLANT
BNDG GAUZE DERMACEA FLUFF 4 (GAUZE/BANDAGES/DRESSINGS) ×2 IMPLANT
CANNULA VESSEL 3MM 2 BLNT TIP (CANNULA) IMPLANT
CORD BIPOLAR FORCEPS 12FT (ELECTRODE) ×2 IMPLANT
COVER SURGICAL LIGHT HANDLE (MISCELLANEOUS) ×2 IMPLANT
CUFF TOURN SGL QUICK 18X4 (TOURNIQUET CUFF) IMPLANT
CUFF TRNQT CYL 24X4X16.5-23 (TOURNIQUET CUFF) IMPLANT
DRAIN PENROSE 12X.25 LTX STRL (MISCELLANEOUS) IMPLANT
GAUZE PACKING IODOFORM 1/4X15 (PACKING) IMPLANT
GAUZE PAD ABD 8X10 STRL (GAUZE/BANDAGES/DRESSINGS) ×4 IMPLANT
GAUZE SPONGE 4X4 12PLY STRL (GAUZE/BANDAGES/DRESSINGS) ×2 IMPLANT
GAUZE XEROFORM 1X8 LF (GAUZE/BANDAGES/DRESSINGS) ×2 IMPLANT
GLOVE BIO SURGEON STRL SZ7.5 (GLOVE) ×2 IMPLANT
GLOVE BIOGEL PI IND STRL 8 (GLOVE) ×2 IMPLANT
GLOVE BIOGEL PI IND STRL 8.5 (GLOVE) IMPLANT
GLOVE PI ORTHO PRO STRL SZ8 (GLOVE) IMPLANT
GLOVE SURG ORTHO 8.0 STRL STRW (GLOVE) IMPLANT
GOWN STRL REUS W/ TWL LRG LVL3 (GOWN DISPOSABLE) ×2 IMPLANT
GOWN STRL REUS W/ TWL XL LVL3 (GOWN DISPOSABLE) ×2 IMPLANT
KIT BASIN OR (CUSTOM PROCEDURE TRAY) ×2 IMPLANT
KIT TURNOVER KIT B (KITS) ×2 IMPLANT
LOOP VASCLR MAXI BLUE 18IN ST (MISCELLANEOUS) IMPLANT
LOOPS VASCLR MAXI BLUE 18IN ST (MISCELLANEOUS) IMPLANT
MANIFOLD NEPTUNE II (INSTRUMENTS) IMPLANT
NDL HYPO 25X1 1.5 SAFETY (NEEDLE) IMPLANT
NEEDLE HYPO 25X1 1.5 SAFETY (NEEDLE) IMPLANT
NS IRRIG 1000ML POUR BTL (IV SOLUTION) ×2 IMPLANT
PACK ORTHO EXTREMITY (CUSTOM PROCEDURE TRAY) ×2 IMPLANT
PAD ARMBOARD POSITIONER FOAM (MISCELLANEOUS) ×4 IMPLANT
PAD CAST 3X4 CTTN HI CHSV (CAST SUPPLIES) IMPLANT
SET CYSTO W/LG BORE CLAMP LF (SET/KITS/TRAYS/PACK) IMPLANT
SOL PREP POV-IOD 4OZ 10% (MISCELLANEOUS) ×4 IMPLANT
SPIKE FLUID TRANSFER (MISCELLANEOUS) ×2 IMPLANT
SPONGE T-LAP 4X18 ~~LOC~~+RFID (SPONGE) ×2 IMPLANT
SUT ETHILON 4 0 P 3 18 (SUTURE) IMPLANT
SUT ETHILON 4 0 PS 2 18 (SUTURE) IMPLANT
SUT MON AB 5-0 P3 18 (SUTURE) IMPLANT
SWAB COLLECTION DEVICE MRSA (MISCELLANEOUS) IMPLANT
SWAB CULTURE ESWAB REG 1ML (MISCELLANEOUS) IMPLANT
SYR 20ML LL LF (SYRINGE) ×2 IMPLANT
SYR CONTROL 10ML LL (SYRINGE) IMPLANT
SYRINGE ORAL 60ML ENFIT (SYRINGE) IMPLANT
TOWEL GREEN STERILE (TOWEL DISPOSABLE) ×2 IMPLANT
TUBE CONNECTING 12X1/4 (SUCTIONS) ×2 IMPLANT
UNDERPAD 30X36 HEAVY ABSORB (UNDERPADS AND DIAPERS) ×2 IMPLANT
YANKAUER SUCT BULB TIP NO VENT (SUCTIONS) ×2 IMPLANT

## 2024-07-17 NOTE — Progress Notes (Signed)
 Postop note: Status post incision and drainage left index and small fingers.  Gross purulence encountered in both digits.  Cultures taken.  Start hydrotherapy in 2 to 4 days.  Okay for DVT prophylaxis.  Okay for discharge home when afebrile, white blood count trending to normal, pain controlled.

## 2024-07-17 NOTE — Anesthesia Postprocedure Evaluation (Signed)
 Anesthesia Post Note  Patient: Clayton Walker  Procedure(s) Performed: IRRIGATION AND DEBRIDEMENT WOUND (Left: Hand)     Patient location during evaluation: PACU Anesthesia Type: General Level of consciousness: awake and alert Pain management: pain level controlled Vital Signs Assessment: post-procedure vital signs reviewed and stable Respiratory status: spontaneous breathing, nonlabored ventilation, respiratory function stable and patient connected to nasal cannula oxygen Cardiovascular status: blood pressure returned to baseline and stable Postop Assessment: no apparent nausea or vomiting Anesthetic complications: no   No notable events documented.  Last Vitals:  Vitals:   07/17/24 1915 07/17/24 1930  BP: 127/78 119/79  Pulse: 78 76  Resp: 14 11  Temp:  36.8 C  SpO2: 97% 98%    Last Pain:  Vitals:   07/17/24 1930  TempSrc:   PainSc: 0-No pain                 Epifanio Lamar BRAVO

## 2024-07-17 NOTE — Consult Note (Signed)
 Clayton Walker is an 28 y.o. male.   Chief Complaint: hand infection HPI: 28 yo male states he has had increasing swelling, pain, erythema left small finger over the past 6 days.  Also with infection of pad of index finger.  States it started when he smashed the finger.  No fevers or chills.  Allergies:  Allergies  Allergen Reactions   Penicillins Hives    Reaction as a toddler     Past Medical History:  Diagnosis Date   Asthma    Asthma    childhood    Past Surgical History:  Procedure Laterality Date   FEMUR FRACTURE SURGERY Left 01/2023   FEMUR IM NAIL Left 03/06/2016   Procedure: INTRAMEDULLARY (IM) NAIL FEMORAL LEFT;  Surgeon: Lonni CINDERELLA Poli, MD;  Location: [facility] OR;  Service: Orthopedics;  Laterality: Left;   HIP ARTHROPLASTY Left 05/2023   INCISION AND DRAINAGE OF WOUND Right 03/06/2016   Procedure: IRRIGATION AND DEBRIDEMENT RIGHT FOOT WOUND AND LACERATION CLOSURE;  Surgeon: Lonni CINDERELLA Poli, MD;  Location: [facility] OR;  Service: Orthopedics;  Laterality: Right;    Family History: Family History  Problem Relation Age of Onset   Fibromyalgia Mother    Hypertension Father    Gout Father     Social History:   reports that he has been smoking cigarettes. He has never used smokeless tobacco. He reports that he does not drink alcohol and does not use drugs.  Medications: Medications Prior to Admission  Medication Sig Dispense Refill   sulfamethoxazole-trimethoprim (BACTRIM DS) 800-160 MG tablet Take 1 tablet by mouth 2 (two) times daily.     [DISCONTINUED] cephALEXin  (KEFLEX ) 500 MG capsule Take 1 capsule (500 mg total) by mouth 4 (four) times daily. 28 capsule 0   [DISCONTINUED] cyclobenzaprine  (FLEXERIL ) 5 MG tablet Take 1 tablet (5 mg total) by mouth at bedtime as needed for muscle spasms. 10 tablet 0   [DISCONTINUED] HYDROcodone -homatropine (HYCODAN) 5-1.5 MG/5ML syrup Take 5 mLs by mouth every 6 (six) hours as needed for cough. 90 mL 0    [DISCONTINUED] meloxicam  (MOBIC ) 7.5 MG tablet Take 1 tablet (7.5 mg total) by mouth daily. 10 tablet 0   [DISCONTINUED] methocarbamol  (ROBAXIN ) 500 MG tablet Take 1 tablet (500 mg total) by mouth 2 (two) times daily. 20 tablet 0    Results for orders placed or performed during the hospital encounter of 07/17/24 (from the past 48 hours)  CBC with Differential     Status: Abnormal   Collection Time: 07/17/24  6:52 AM  Result Value Ref Range   WBC 14.7 (H) 4.0 - 10.5 K/uL   RBC 4.99 4.22 - 5.81 MIL/uL   Hemoglobin 13.0 13.0 - 17.0 g/dL   HCT 58.7 60.9 - 47.9 %   MCV 82.6 80.0 - 100.0 fL   MCH 26.1 26.0 - 34.0 pg   MCHC 31.6 30.0 - 36.0 g/dL   RDW 83.6 (H) 88.4 - 84.4 %   Platelets 349 150 - 400 K/uL   nRBC 0.0 0.0 - 0.2 %   Neutrophils Relative % 70 %   Neutro Abs 10.5 (H) 1.7 - 7.7 K/uL   Lymphocytes Relative 16 %   Lymphs Abs 2.3 0.7 - 4.0 K/uL   Monocytes Relative 10 %   Monocytes Absolute 1.4 (H) 0.1 - 1.0 K/uL   Eosinophils Relative 3 %   Eosinophils Absolute 0.4 0.0 - 0.5 K/uL   Basophils Relative 1 %   Basophils Absolute 0.1 0.0 - 0.1 K/uL  Immature Granulocytes 0 %   Abs Immature Granulocytes 0.04 0.00 - 0.07 K/uL    Comment: Performed at [facility], [address], [place], [place] [address]  Basic metabolic panel     Status: None   Collection Time: 07/17/24  6:52 AM  Result Value Ref Range   Sodium 136 135 - 145 mmol/L   Potassium 4.0 3.5 - 5.1 mmol/L   Chloride 102 98 - 111 mmol/L   CO2 24 22 - 32 mmol/L   Glucose, Bld 94 70 - 99 mg/dL    Comment: Glucose reference range applies only to samples taken after fasting for at least 8 hours.   BUN 11 6 - 20 mg/dL   Creatinine, Ser 9.16 0.61 - 1.24 mg/dL   Calcium 9.1 8.9 - 89.6 mg/dL   GFR, Estimated >39 >39 mL/min    Comment: (NOTE) Calculated using the CKD-EPI Creatinine Equation (2021)    Anion gap 10 5 - 15    Comment: Performed at [facility], [address]  [place], [place] [address]    DG Hand Complete Left Result Date: 07/17/2024 CLINICAL DATA:  Left hand crush injury, swelling EXAM: LEFT HAND - COMPLETE 3+ VIEW COMPARISON:  None Available. FINDINGS: Normal alignment. No acute fracture or dislocation. Joint spaces are preserved. No erosions. 7 mm angular retained radiopaque foreign body is seen within the soft tissues along the palmar aspect of the second web space, possibly representing a retained glass fragment. There is extensive soft tissue swelling involving the second and fifth digits diffusely. IMPRESSION: 1. No acute fracture or dislocation. 2. 7 mm retained radiopaque foreign body within the soft tissues along the palmar aspect of the second web space. 3. Extensive soft tissue swelling of the second and fifth digits. Electronically Signed   By: Dorethia Molt M.D.   On: 07/17/2024 03:18      Blood pressure (!) 151/110, pulse 73, temperature 98.3 F (36.8 C), temperature source Oral, resp. rate 16, height 5' 10 (1.778 m), weight 63.5 kg, SpO2 100%.  General appearance: alert, cooperative, and appears stated age Head: Normocephalic, without obvious abnormality, atraumatic Neck: supple, symmetrical, trachea midline Extremities: Intact sensation and capillary refill all digits.  +epl/fpl/io.  No wounds. Swelling of pad of left index with fluctuant area under skin at radial side.  Small finger swollen and erythematous.  Fluctuant area on radial side of middle phalanx.  Tender along volar surface of finger.  Non tender in palm.  Pain with extension of finger.  Able to lightly flex. Skin: Skin color, texture, turgor normal. No rashes or lesions Neurologic: Grossly normal Incision/Wound: none  Assessment/Plan Left index and small finger infection.  Recommend incision and drainage in OR including flexor sheath of small finger.  Risks, benefits and alternatives of surgery were discussed including risks of blood loss, infection, damage to  nerves/vessels/tendons/ligament/bone, failure of surgery, need for additional surgery, complication with wound healing, stiffness, need for repeat irrigation and debridement.  He voiced understanding of these risks and elected to proceed.    Clayton Walker 07/17/2024, 5:09 PM

## 2024-07-17 NOTE — Discharge Instructions (Signed)

## 2024-07-17 NOTE — Progress Notes (Signed)
 ED Pharmacy Antibiotic Sign Off An antibiotic consult was received from an ED provider for Vancomycin  per pharmacy dosing for cellulitis. A chart review was completed to assess appropriateness.   The following one time order(s) were placed:  Vancomycin  1gm IV  Further antibiotic and/or antibiotic pharmacy consults should be ordered by the admitting provider if indicated.   Thank you for allowing pharmacy to be a part of this patient's care.   Rosaline Millet, [facility]  Clinical Pharmacist 07/17/24 6:13 AM
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
## 2024-07-17 NOTE — ED Triage Notes (Signed)
 Pt coming in with red/swollen/painful left pinky finger that has been getting progressively worse since Saturday morning. Seen at [facility] yesterday and started on oral ATB. Pt reports no improvement in symptoms since. Denies fever.

## 2024-07-17 NOTE — H&P (Signed)
 History and Physical  Clayton Walker FMW:[id number] DOB: 1996-01-24 DOA: 07/17/2024  PCP: Cleatus Arlyss RAMAN, MD   Chief Complaint: hand injury   HPI: Clayton Walker is a 28 y.o. male without significant past medical history sustained injury to his left hand by dropping a small washing machine on it about 1 week ago, has had progressive pain and swelling especially of his index finger and fifth digit.  No systemic symptoms.  No drainage.  Went to urgent care yesterday was started on p.o. Bactrim but seen no improvement.  Here in the emergency department, ER provider discussed with Dr. Murrell hand surgery who recommends hospitalist admission to [facility] for surgical evaluation this morning.  Review of Systems: Please see HPI for pertinent positives and negatives. A complete 10 system review of systems are otherwise negative.  Past Medical History:  Diagnosis Date   Asthma    Asthma    childhood   Past Surgical History:  Procedure Laterality Date   FEMUR IM NAIL Left 03/06/2016   Procedure: INTRAMEDULLARY (IM) NAIL FEMORAL LEFT;  Surgeon: Lonni CINDERELLA Poli, MD;  Location: [facility] OR;  Service: Orthopedics;  Laterality: Left;   INCISION AND DRAINAGE OF WOUND Right 03/06/2016   Procedure: IRRIGATION AND DEBRIDEMENT RIGHT FOOT WOUND AND LACERATION CLOSURE;  Surgeon: Lonni CINDERELLA Poli, MD;  Location: [facility] OR;  Service: Orthopedics;  Laterality: Right;   Social History:  reports that he has been smoking cigarettes. He has never used smokeless tobacco. He reports that he does not drink alcohol and does not use drugs.  Allergies  Allergen Reactions   Penicillins Hives    Reaction as a toddler     Family History  Problem Relation Age of Onset   Fibromyalgia Mother    Hypertension Father    Gout Father      Prior to Admission medications   Medication Sig Start Date End Date Taking? Authorizing Provider  sulfamethoxazole-trimethoprim (BACTRIM DS) 800-160 MG tablet Take 1  tablet by mouth 2 (two) times daily. 07/15/24  Yes [provider]    Physical Exam: BP (!) 128/93   Pulse 84   Temp 98.5 F (36.9 C) (Oral)   Resp 16   Ht 5' 10 (1.778 m)   Wt 63.5 kg   SpO2 100%   BMI 20.09 kg/m  General:  Alert, oriented, calm, in mild distress from left hand pain.  However he generally looks nontoxic and otherwise comfortable Cardiovascular: RRR, no murmurs or rubs, no peripheral edema  Respiratory: clear to auscultation bilaterally, no wheezes, no crackles  Abdomen: soft, nontender, nondistended, normal bowel tones heard  Skin: dry, no rashes  Musculoskeletal: Left index finger and fifth digit swelling, extreme tenderness, erythema          Labs on Admission:  Basic Metabolic Panel: Recent Labs  Lab 07/17/24 0652  NA 136  K 4.0  CL 102  CO2 24  GLUCOSE 94  BUN 11  CREATININE 0.83  CALCIUM 9.1   Liver Function Tests: No results for input(s): AST, ALT, ALKPHOS, BILITOT, PROT, ALBUMIN in the last 168 hours. No results for input(s): LIPASE, AMYLASE in the last 168 hours. No results for input(s): AMMONIA in the last 168 hours. CBC: Recent Labs  Lab 07/17/24 0652  WBC 14.7*  NEUTROABS 10.5*  HGB 13.0  HCT 41.2  MCV 82.6  PLT 349   Cardiac Enzymes: No results for input(s): CKTOTAL, CKMB, CKMBINDEX, TROPONINI in the last 168 hours. BNP (last 3 results) No  results for input(s): BNP in the last 8760 hours.  ProBNP (last 3 results) No results for input(s): PROBNP in the last 8760 hours.  CBG: No results for input(s): GLUCAP in the last 168 hours.  Radiological Exams on Admission: DG Hand Complete Left Result Date: 07/17/2024 CLINICAL DATA:  Left hand crush injury, swelling EXAM: LEFT HAND - COMPLETE 3+ VIEW COMPARISON:  None Available. FINDINGS: Normal alignment. No acute fracture or dislocation. Joint spaces are preserved. No erosions. 7 mm angular retained radiopaque foreign body is seen within the  soft tissues along the palmar aspect of the second web space, possibly representing a retained glass fragment. There is extensive soft tissue swelling involving the second and fifth digits diffusely. IMPRESSION: 1. No acute fracture or dislocation. 2. 7 mm retained radiopaque foreign body within the soft tissues along the palmar aspect of the second web space. 3. Extensive soft tissue swelling of the second and fifth digits. Electronically Signed   By: Dorethia Molt M.D.   On: 07/17/2024 03:18   Assessment/Plan This is a healthy 28 year old gentleman who dropped a small appliance on his left hand 1 week ago, has developed pain swelling erythema especially of the index finger and left fifth digit.  No systemic symptoms, or signs of sepsis.  Lab work is unremarkable, he has mild leukocytosis.  Left hand tenosynovitis-after crush injury -Inpatient admission -Keep n.p.o. -Empiric IV vancomycin  -Pain control as needed -2 OR with Dr. Murrell this morning at [facility]  DVT prophylaxis: SCDs only pending surgery    Code Status: Full Code  Consults called: Dr. Murrell hand surgery  Admission status: The appropriate patient status for this patient is INPATIENT. Inpatient status is judged to be reasonable and necessary in order to provide the required intensity of service to ensure the patient's safety. The patient's presenting symptoms, physical exam findings, and initial radiographic and laboratory data in the context of their chronic comorbidities is felt to place them at high risk for further clinical deterioration. Furthermore, it is not anticipated that the patient will be medically stable for discharge from the hospital within 2 midnights of admission.    I certify that at the point of admission it is my clinical judgment that the patient will require inpatient hospital care spanning beyond 2 midnights from the point of admission due to high intensity of service, high risk for further deterioration and high  frequency of surveillance required  Time spent: 49 minutes  Cassanda Walmer CHRISTELLA Gail MD [facility] Pager [phone]  If 7PM-7AM, please contact night-coverage [email or web address] Password [facility]  07/17/2024, 8:17 AM

## 2024-07-17 NOTE — Transfer of Care (Signed)
 Immediate Anesthesia Transfer of Care Note  Patient: Clayton Walker  Procedure(s) Performed: IRRIGATION AND DEBRIDEMENT WOUND (Left: Hand)  Patient Location: PACU  Anesthesia Type:General  Level of Consciousness: awake, alert , and oriented  Airway & Oxygen Therapy: Patient Spontanous Breathing and Patient connected to face mask oxygen  Post-op Assessment: Report given to RN and Post -op Vital signs reviewed and stable  Post vital signs: Reviewed and stable  Last Vitals:  Vitals Value Taken Time  BP 101/67 07/17/24 18:34  Temp 36.7 C 07/17/24 18:34  Pulse 80 07/17/24 18:39  Resp 13 07/17/24 18:39  SpO2 99 % 07/17/24 18:39  Vitals shown include unfiled device data.  Last Pain:  Vitals:   07/17/24 1214  TempSrc:   PainSc: 10-Worst pain ever         Complications: No notable events documented.

## 2024-07-17 NOTE — Op Note (Signed)
 NAME: Clayton Walker MEDICAL RECORD NO: [id number] DATE OF BIRTH: 08-Apr-1996 FACILITY: [facility] LOCATION: [facility] OR PHYSICIAN: Cloee Dunwoody R. Mykeria Garman, MD   OPERATIVE REPORT   DATE OF PROCEDURE: 07/17/24    PREOPERATIVE DIAGNOSIS: Left index and small finger infection   POSTOPERATIVE DIAGNOSIS: Left index and small finger infection   PROCEDURE: 1.  Left small finger incision and drainage of flexor sheath 2.  Left small finger incision and drainage of dorsal abscess 3.  Left index finger incision and drainage of felon   SURGEON:  Carlesha Seiple, M.D.   ASSISTANT: none   ANESTHESIA:  General   INTRAVENOUS FLUIDS:  Per anesthesia flow sheet.   ESTIMATED BLOOD LOSS:  Minimal.   COMPLICATIONS:  None.   SPECIMENS: Cultures to micro   TOURNIQUET TIME:    Total Tourniquet Time Documented: Upper Arm (Left) - 35 minutes Total: Upper Arm (Left) - 35 minutes    DISPOSITION:  Stable to PACU.   INDICATIONS: 28 year old male states he smashed his fingers approximately a week ago.  Over the past 6 days he has had increasing swelling pain and erythema of the index and small finger.  I recommended incision and drainage in the operating room.  Risks, benefits and alternatives of surgery were discussed including the risks of blood loss, infection, damage to nerves, vessels, tendons, ligaments, bone for surgery, need for additional surgery, complications with wound healing, continued pain, stiffness, , need for repeat irrigation and debridement.  He voiced understanding of these risks and elected to proceed.  OPERATIVE COURSE:  After being identified preoperatively by myself,  the patient and I agreed on the procedure and site of the procedure.  The surgical site was marked.  Surgical consent had been signed.  He is on scheduled antibiotics.  He was transferred to the operating room and placed on the operating table in supine position with the Left upper extremity on an arm board.  General  anesthesia was induced by the anesthesiologist.  Left upper extremity was prepped and draped in normal sterile orthopedic fashion.  A surgical pause was performed between the surgeons, anesthesia, and operating room staff and all were in agreement as to the patient, procedure, and site of procedure.  Tourniquet at the proximal aspect of the extremity was inflated to 250 mmHg after exsanguination of the arm with an Esmarch bandage.  Incision was made at the volar aspect of the MP joint of the small finger.  This was carried in subcutaneous tissues by spreading technique.  The A1 pulley was opened.  There was fluid within the sheath but not gross purulence.  Incision was made at the volar aspect of the distal phalanx.  This was carried in subcutaneous tissues.  Gross purulence was encountered going down to the flexor sheath.  The finger was milked and gross purulence was expressed.  Cultures were taken for aerobes and anaerobes.  There was a fluctuant area at the dorsal radial aspect of the small finger.  This was opened.  The skin was delaminated.  Devitalized skin was removed sharply with the scissors.  Skin incision was made.  There is an area where the skin had eroded through and there was greenish material.  Gross purulence was encountered in the dorsal abscess of the finger as well.  This communicated toward the pad of the finger.  The purulence is debrided with a Ray-Tec sponge and smooth pickups.  The radial digital nerve and artery were identified and were intact.  A #5 pediatric  feeding tube was placed into the flexor sheath from the proximal wound.  This was used to irrigate the sheath.  Good effluent was obtained from both proximal and distal wounds.  The wounds including the dorsal wound were then copiously irrigated with sterile saline by bulb syringe.  An incision was then made at the radial side of the index finger over the fluctuant area at the distal phalanx.  Gross purulence was encountered.   Scissors were used to spread the septae of the pad of the finger.  The purulence was debrided by milking the finger and with the smooth pickups.  The wound was copiously irrigated with sterile saline.  All wounds were then packed with quarter inch iodoform gauze.  Digital blocks were performed with quarter percent plain Marcaine  to aid in postoperative analgesia.  The wounds were dressed with sterile 4 x 4's and wrapped with a Kerlix bandage.  Volar splint was placed and wrapped with Kerlix and Ace bandage.  The tourniquet was deflated at 35 minutes.  Fingertips were pink with brisk capillary refill after deflation of tourniquet.  The operative  drapes were broken down.  The patient was awoken from anesthesia safely.  He was transferred back to the stretcher and taken to PACU in stable condition.  He is admitted for IV antibiotics.     Devonta Blanford, MD Electronically signed, 07/17/24
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
## 2024-07-17 NOTE — ED Provider Notes (Signed)
 Patient care received in handoff at shift change.  Discussed with Dr. Roxane hospital service who will admit at [facility] patient is n.p.o. has received vancomycin  and is awaiting surgery on-call hand surgeon today   Physical Exam  BP (!) 128/93   Pulse 84   Temp 98.5 F (36.9 C) (Oral)   Resp 16   Ht 5' 10 (1.778 m)   Wt 63.5 kg   SpO2 100%   BMI 20.09 kg/m   Physical Exam         Procedures  Procedures  ED Course / MDM    Medical Decision Making Amount and/or Complexity of Data Reviewed Labs: ordered. Radiology: ordered.  Risk Prescription drug management. Decision regarding hospitalization.   Patient admitted at [facility].       Clayton Walker, Clayton Walker 07/17/24 0810    Clayton Maude BROCKS, MD 07/17/24 [phone]

## 2024-07-17 NOTE — Anesthesia Preprocedure Evaluation (Signed)
 Anesthesia Evaluation  Patient identified by MRN, date of birth, ID band Patient awake    Reviewed: Allergy & Precautions, H&P , NPO status , Patient's Chart, lab work & pertinent test results  Airway Mallampati: II   Neck ROM: full    Dental   Pulmonary asthma , Current Smoker   breath sounds clear to auscultation       Cardiovascular negative cardio ROS  Rhythm:regular Rate:Normal     Neuro/Psych    GI/Hepatic   Endo/Other    Renal/GU      Musculoskeletal   Abdominal   Peds  Hematology   Anesthesia Other Findings   Reproductive/Obstetrics                              Anesthesia Physical Anesthesia Plan  ASA: 2  Anesthesia Plan: General   Post-op Pain Management:    Induction: Intravenous  PONV Risk Score and Plan: 1 and Ondansetron , Dexamethasone , Treatment may vary due to age or medical condition and Midazolam   Airway Management Planned: LMA  Additional Equipment:   Intra-op Plan:   Post-operative Plan: Extubation in OR  Informed Consent: I have reviewed the patients History and Physical, chart, labs and discussed the procedure including the risks, benefits and alternatives for the proposed anesthesia with the patient or authorized representative who has indicated his/her understanding and acceptance.     Dental advisory given  Plan Discussed with: CRNA, Anesthesiologist and Surgeon  Anesthesia Plan Comments:         Anesthesia Quick Evaluation

## 2024-07-17 NOTE — Anesthesia Procedure Notes (Signed)
 Procedure Name: LMA Insertion Date/Time: 07/17/2024 5:29 PM  Performed by: Mannie Krystal LABOR, CRNAPre-anesthesia Checklist: Patient identified, Emergency Drugs available, Suction available and Patient being monitored Patient Re-evaluated:Patient Re-evaluated prior to induction Oxygen Delivery Method: Circle system utilized Preoxygenation: Pre-oxygenation with 100% oxygen Induction Type: IV induction LMA: LMA with gastric port inserted LMA Size: 4.0 Tube type: Oral Number of attempts: 1 Airway Equipment and Method: Stylet and Oral airway Placement Confirmation: positive ETCO2 and breath sounds checked- equal and bilateral Tube secured with: Tape Dental Injury: Teeth and Oropharynx as per pre-operative assessment

## 2024-07-18 ENCOUNTER — Encounter (HOSPITAL_COMMUNITY): Payer: Self-pay | Admitting: Orthopedic Surgery

## 2024-07-18 DIAGNOSIS — M659 Unspecified synovitis and tenosynovitis, unspecified site: Secondary | ICD-10-CM | POA: Diagnosis not present

## 2024-07-18 LAB — HIV ANTIBODY (ROUTINE TESTING W REFLEX): HIV Screen 4th Generation wRfx: NONREACTIVE

## 2024-07-18 MED ORDER — OXYCODONE-ACETAMINOPHEN 5-325 MG PO TABS
1.0000 | ORAL_TABLET | ORAL | Status: DC | PRN
  Administered 2024-07-18 – 2024-07-20 (×9): 1 via ORAL
  Filled 2024-07-18 (×9): qty 1

## 2024-07-18 MED ORDER — OXYCODONE HCL 5 MG PO TABS
5.0000 mg | ORAL_TABLET | ORAL | Status: DC | PRN
  Administered 2024-07-19 – 2024-07-20 (×6): 5 mg via ORAL
  Filled 2024-07-18 (×6): qty 1

## 2024-07-18 MED ORDER — VITAMIN C 500 MG PO TABS
1000.0000 mg | ORAL_TABLET | Freq: Every day | ORAL | Status: DC
  Administered 2024-07-18 – 2024-07-20 (×3): 1000 mg via ORAL
  Filled 2024-07-18 (×3): qty 2

## 2024-07-18 MED ORDER — NAPROXEN 250 MG PO TABS
500.0000 mg | ORAL_TABLET | Freq: Two times a day (BID) | ORAL | Status: DC
  Administered 2024-07-18 – 2024-07-20 (×4): 500 mg via ORAL
  Filled 2024-07-18 (×5): qty 2
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
## 2024-07-18 NOTE — Plan of Care (Signed)

## 2024-07-19 DIAGNOSIS — M659 Unspecified synovitis and tenosynovitis, unspecified site: Secondary | ICD-10-CM | POA: Diagnosis not present

## 2024-07-19 LAB — CBC
HCT: 40.8 % (ref 39.0–52.0)
Hemoglobin: 12.8 g/dL — ABNORMAL LOW (ref 13.0–17.0)
MCH: 27.1 pg (ref 26.0–34.0)
MCHC: 31.4 g/dL (ref 30.0–36.0)
MCV: 86.3 fL (ref 80.0–100.0)
Platelets: 268 K/uL (ref 150–400)
RBC: 4.73 MIL/uL (ref 4.22–5.81)
RDW: 16.8 % — ABNORMAL HIGH (ref 11.5–15.5)
WBC: 7.8 K/uL (ref 4.0–10.5)
nRBC: 0 % (ref 0.0–0.2)
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
## 2024-07-19 NOTE — Plan of Care (Signed)
   Problem: Education: Goal: Knowledge of General Education information will improve Description: Including pain rating scale, medication(s)/side effects and non-pharmacologic comfort measures Outcome: Progressing   Problem: Clinical Measurements: Goal: Will remain free from infection Outcome: Progressing

## 2024-07-20 ENCOUNTER — Other Ambulatory Visit (HOSPITAL_COMMUNITY): Payer: Self-pay

## 2024-07-20 DIAGNOSIS — M659 Unspecified synovitis and tenosynovitis, unspecified site: Secondary | ICD-10-CM | POA: Diagnosis not present

## 2024-07-20 LAB — CBC
HCT: 41.8 % (ref 39.0–52.0)
Hemoglobin: 13.2 g/dL (ref 13.0–17.0)
MCH: 26.9 pg (ref 26.0–34.0)
MCHC: 31.6 g/dL (ref 30.0–36.0)
MCV: 85.1 fL (ref 80.0–100.0)
Platelets: 274 K/uL (ref 150–400)
RBC: 4.91 MIL/uL (ref 4.22–5.81)
RDW: 17 % — ABNORMAL HIGH (ref 11.5–15.5)
WBC: 7 K/uL (ref 4.0–10.5)
nRBC: 0 % (ref 0.0–0.2)

## 2024-07-20 LAB — VANCOMYCIN, PEAK: Vancomycin Pk: 23 ug/mL — ABNORMAL LOW (ref 30–40)

## 2024-07-20 MED ORDER — AMOXICILLIN-POT CLAVULANATE 875-125 MG PO TABS
1.0000 | ORAL_TABLET | Freq: Two times a day (BID) | ORAL | 0 refills | Status: DC
  Filled 2024-07-20: qty 12, 6d supply, fill #0

## 2024-07-20 MED ORDER — OXYCODONE HCL 5 MG PO TABS
5.0000 mg | ORAL_TABLET | ORAL | 0 refills | Status: DC | PRN
  Filled 2024-07-20: qty 30, 5d supply, fill #0

## 2024-07-20 MED ORDER — LINEZOLID 600 MG PO TABS
600.0000 mg | ORAL_TABLET | Freq: Two times a day (BID) | ORAL | 0 refills | Status: DC
  Filled 2024-07-20: qty 12, 6d supply, fill #0

## 2024-07-20 MED ORDER — ASCORBIC ACID 1000 MG PO TABS: 1000.0000 mg | ORAL_TABLET | Freq: Every day | ORAL | Status: DC

## 2024-07-20 MED ORDER — NAPROXEN 500 MG PO TABS: 500.0000 mg | ORAL_TABLET | Freq: Two times a day (BID) | ORAL | Status: DC

## 2024-07-20 NOTE — TOC CM/SW Note (Signed)
 Transition of Care [facility]) - Inpatient Brief Assessment   Patient Details  Name: Orris Perin MRN: [id number] Date of Birth: [birth year]  Transition of Care [facility]) CM/SW Contact:    Lauraine FORBES Saa, LCSWA Phone Number: 07/20/2024, 9:02 AM   Clinical Narrative:  9:10 AM Per chart review, patient resides at home alone. Patient has a PCP and insurance. Patient does not have SNF history. Patient has HH and DME (rolling walker) history with [facility]. Patient's preferred pharmacy's are [facility] [place], [facility] [phone] [place], and [facility] [phone] [place]. No TOC needs were identified at this time. TOC will continue to follow and be available to assist.  Transition of Care Asessment: Insurance and Status: Insurance coverage has been reviewed Patient has primary care physician: Yes Home environment has been reviewed: Private Residence Prior level of function:: N/A Prior/Current Home Services: No current home services Social Drivers of Health Review: SDOH reviewed no interventions necessary Readmission risk has been reviewed: Yes (Currently Green 6%) Transition of care needs: no transition of care needs at this time

## 2024-07-20 NOTE — Plan of Care (Signed)
   Problem: Education: Goal: Knowledge of General Education information will improve Description: Including pain rating scale, medication(s)/side effects and non-pharmacologic comfort measures Outcome: Progressing   Problem: Health Behavior/Discharge Planning: Goal: Ability to manage health-related needs will improve Outcome: Progressing   Problem: Clinical Measurements: Goal: Will remain free from infection Outcome: Progressing

## 2024-07-20 NOTE — Care Plan (Signed)
 Discharge instructions given to patient. No concerns noted.  IV access removed.
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
## 2024-07-22 ENCOUNTER — Ambulatory Visit: Payer: Self-pay | Admitting: Family Medicine

## 2024-07-22 ENCOUNTER — Other Ambulatory Visit (HOSPITAL_COMMUNITY): Payer: Self-pay

## 2024-07-22 LAB — AEROBIC/ANAEROBIC CULTURE W GRAM STAIN (SURGICAL/DEEP WOUND)

## 2024-07-23 ENCOUNTER — Telehealth: Payer: Self-pay | Admitting: Family Medicine

## 2024-07-23 NOTE — Telephone Encounter (Signed)
 Are you willing to see this patient? You have not seen him in office since 2017

## 2024-07-23 NOTE — Telephone Encounter (Unsigned)
 Copied from CRM #[id number]. Topic: General - Other >> Jul 23, 2024  8:15 AM Gennette ORN wrote: Reason for CRM: Patient is calling in for a medication refill for Oxycodone  5MG  he was prescribed before by hospital. Patient hasn't had new patient appointment as well he also needs a hospital follow up schedule. Please follow up with patient.

## 2024-07-23 NOTE — Telephone Encounter (Signed)
 Glad to see patient. Please set up follow up.  How is he doing with pain control and how much pain medication has he needed in the meantime, ie daily average?  Please let me know.  Thanks.

## 2024-07-24 ENCOUNTER — Other Ambulatory Visit: Payer: Self-pay | Admitting: Family Medicine

## 2024-07-24 MED ORDER — OXYCODONE HCL 5 MG PO TABS: 5.0000 mg | ORAL_TABLET | ORAL | 0 refills | Status: DC | PRN

## 2024-07-24 NOTE — Telephone Encounter (Signed)
 Patient states that he is taking about 4-5 tablets per day. He has approximate 4 left. I have scheduled him to be seen on 8/25 and not sure if you can send something to at least get him through the weekend

## 2024-07-24 NOTE — Telephone Encounter (Signed)
 Left voicemail for patient to return call to office.

## 2024-07-24 NOTE — Progress Notes (Signed)
Closing encounter this is a duplicate

## 2024-07-24 NOTE — Progress Notes (Signed)
 I sent 10 tabs to the pharmacy in the meantime.  Thanks for getting him set up.  Thanks.

## 2024-07-24 NOTE — Telephone Encounter (Signed)
 I sent 10 tabs to the pharmacy in the meantime.  Thanks for getting him set up.  Thanks.

## 2024-07-24 NOTE — Telephone Encounter (Signed)
 Reached out to patient and left voicemail advising that Dr. Cleatus sent his rx to the pharmacy

## 2024-07-27 ENCOUNTER — Telehealth: Payer: Self-pay

## 2024-07-27 ENCOUNTER — Ambulatory Visit: Admitting: Family Medicine

## 2024-07-27 VITALS — BP 138/76 | HR 105 | Temp 98.6°F | Ht 70.47 in | Wt 138.4 lb

## 2024-07-27 DIAGNOSIS — L03019 Cellulitis of unspecified finger: Secondary | ICD-10-CM

## 2024-07-27 DIAGNOSIS — Z7189 Other specified counseling: Secondary | ICD-10-CM | POA: Diagnosis not present

## 2024-07-27 MED ORDER — OXYCODONE HCL 5 MG PO TABS: 5.0000 mg | ORAL_TABLET | ORAL | 0 refills | Status: DC | PRN

## 2024-07-27 NOTE — Progress Notes (Unsigned)
 New patient.  Reestablishing care. L hand injury f/u.  He had a crush injury on his left hand with subsequent infection requiring debridement with packing placement.  He was splinted, treated with antibiotics, and discharged from the hospital.  Here for follow-up today.  He is still splinted and pain is slowly improving. Done with abx.  No fevers.  He has not had the bandages changed since he left the hospital.  Discussed with patient about hand clinic follow-up.  He has not seen them yet.  Advance directive discussed with patient.  Father designated if patient were incapacitated.  He is working on quitting smoking.  Discussed.  Meds, vitals, and allergies reviewed.   ROS: Per HPI unless specifically indicated in ROS section   Nad [place]ck supple, no LA Rrr Ctab  Abd soft, not ttp Left hand was initially splinted.  We took that down at the office visit.  I cut away the bandage and remove the splint.  The packing is still in place but some of the packing was adherent to the 4 x 4's used in the dressing.  This was trimmed away but packing was left in place.  He has range of motion intact for the fingers but I did not test for full range of motion.  Normal capillary refill.  He does not have spreading erythema at the I&D sites on the 2nd and 5th fingers.  Normal radial pulse. I recovered the pack to sites with nonstick bandages and then 4 x 4's and roll gauze.  I resplinted his arm and covered the palm and fingers with Coban to protect the bandage.  Tolerated well.  No complication.  35 minutes were devoted to patient care in this encounter (this includes time spent reviewing the patient's file/history, interviewing and examining the patient, counseling/reviewing plan with patient).

## 2024-07-27 NOTE — Telephone Encounter (Signed)
 Patient seen in office today by Dr. Cleatus. Reached out to Dr. Murrell office at [phone] due to pcp was changing patients bandage and wanted to make sure that the packing can stay in. Spoke with Karna who confirmed with triage that the packing can stay. In addition the patient was provided number to Dr. Murrell so that he can make an appointment to be seen.

## 2024-07-27 NOTE — Patient Instructions (Signed)
 Call the hand clinic about follow up as soon as possible.  [phone].   Use the pain medication with sedation caution, taper as tolerated.  Take care.  Glad to see you.

## 2024-07-29 ENCOUNTER — Encounter: Payer: Self-pay | Admitting: Family Medicine

## 2024-07-29 DIAGNOSIS — L03019 Cellulitis of unspecified finger: Secondary | ICD-10-CM | POA: Insufficient documentation

## 2024-07-29 DIAGNOSIS — Z7189 Other specified counseling: Secondary | ICD-10-CM | POA: Insufficient documentation

## 2024-07-29 NOTE — Assessment & Plan Note (Signed)
  Advance directive discussed with patient.  Father designated if patient were incapacitated.

## 2024-07-29 NOTE — Assessment & Plan Note (Addendum)
 Done with antibiotics.  No fevers.  See exam above.  Wounds redressed and hand resplinted.  Discussed getting follow-up with the hand clinic.  Phone call was made to the hand clinic during office visit today about leaving packing in place.  Oxycodone  prescription sent for patient in the meantime, for short-term use.  Not sedated.  Okay for outpatient follow-up.

## 2024-07-31 ENCOUNTER — Other Ambulatory Visit: Payer: Self-pay | Admitting: Family Medicine

## 2024-07-31 NOTE — Telephone Encounter (Signed)
 Copied from CRM [id number]. Topic: Clinical - Medication Refill >> Jul 31, 2024 11:12 AM Laymon HERO wrote: Medication: oxyCODONE  (OXY IR/ROXICODONE ) 5 MG immediate release tablet  Has the patient contacted their pharmacy? Yes (Agent: If no, request that the patient contact the pharmacy for the refill. If patient does not wish to contact the pharmacy document the reason why and proceed with request.) (Agent: If yes, when and what did the pharmacy advise?)  This is the patient's preferred pharmacy:  [id number] [place], [place] - [address] AT [facility] OF [facility] & [address] [place] [address] Phone: [phone] Fax: [phone]   Is this the correct pharmacy for this prescription? Yes If no, delete pharmacy and type the correct one.   Has the prescription been filled recently? Yes  Is the patient out of the medication? Yes  Has the patient been seen for an appointment in the last year OR does the patient have an upcoming appointment? Yes  Can we respond through MyChart? Yes  Agent: Please be advised that Rx refills may take up to 3 business days. We ask that you follow-up with your pharmacy.

## 2024-08-04 NOTE — Telephone Encounter (Signed)
 LAST APPOINTMENT DATE: 07/27/2024   NEXT APPOINTMENT DATE: Visit date not found    LAST REFILL:07/22/24  QTY: #20 w/ 0 refills

## 2024-08-04 NOTE — Telephone Encounter (Unsigned)
 Copied from CRM [id number]. Topic: Clinical - Medication Question >> Aug 04, 2024 10:05 AM Sophia H wrote: Reason for CRM: Patient calling in to check on refill request. He states he is completely out of his meds and is having pain, wants to have refilled asap. Advised of 3 bus day turnaround.

## 2024-08-05 MED ORDER — OXYCODONE HCL 5 MG PO TABS: 5.0000 mg | ORAL_TABLET | ORAL | 0 refills | Status: AC | PRN

## 2024-08-05 NOTE — Telephone Encounter (Signed)
 Sent. Thanks.
